# Patient Record
Sex: Female | Born: 1985 | Hispanic: Yes | Marital: Single | Smoking: Never smoker
Health system: Southern US, Community
[De-identification: ages and names within clinical notes are randomized; demographics above are authoritative.]

## PROBLEM LIST (undated history)

## (undated) DIAGNOSIS — O139 Gestational [pregnancy-induced] hypertension without significant proteinuria, unspecified trimester: Secondary | ICD-10-CM

## (undated) DIAGNOSIS — O24419 Gestational diabetes mellitus in pregnancy, unspecified control: Secondary | ICD-10-CM

## (undated) DIAGNOSIS — D649 Anemia, unspecified: Secondary | ICD-10-CM

## (undated) HISTORY — DX: Gestational (pregnancy-induced) hypertension without significant proteinuria, unspecified trimester: O13.9

---

## 2021-01-10 ENCOUNTER — Other Ambulatory Visit: Payer: Self-pay

## 2021-01-10 ENCOUNTER — Ambulatory Visit (INDEPENDENT_AMBULATORY_CARE_PROVIDER_SITE_OTHER): Payer: Medicaid Other

## 2021-01-10 VITALS — BP 119/74 | HR 68 | Ht 59.0 in | Wt 149.0 lb

## 2021-01-10 DIAGNOSIS — Z3201 Encounter for pregnancy test, result positive: Secondary | ICD-10-CM

## 2021-01-10 LAB — POCT URINE PREGNANCY: Preg Test, Ur: POSITIVE — AB

## 2021-01-10 MED ORDER — PRENATAL VITAMINS 28-0.8 MG PO TABS: ORAL_TABLET | ORAL | 11 refills | Status: DC

## 2021-01-10 NOTE — Progress Notes (Signed)
Pt presents for UPT.UPT positive. Pt states LMP was 11/07/20. Advised pt to take prenatal vitamins and to start prenatal care.  Kolston Lacount l Morty Ortwein, CMA .

## 2021-01-11 NOTE — Progress Notes (Signed)
Chart reviewed - agree with CMA/RN documentation.  ° °

## 2021-01-31 ENCOUNTER — Encounter: Payer: Medicaid Other | Admitting: Family Medicine

## 2021-02-06 ENCOUNTER — Other Ambulatory Visit (HOSPITAL_COMMUNITY)
Admission: RE | Admit: 2021-02-06 | Discharge: 2021-02-06 | Disposition: A | Discharge status: Home / Self Care | Payer: Medicaid Other | Source: Ambulatory Visit | Attending: Family Medicine | Admitting: Family Medicine

## 2021-02-06 ENCOUNTER — Encounter: Payer: Self-pay | Admitting: Certified Nurse Midwife

## 2021-02-06 ENCOUNTER — Ambulatory Visit (INDEPENDENT_AMBULATORY_CARE_PROVIDER_SITE_OTHER): Payer: Medicaid Other | Admitting: Certified Nurse Midwife

## 2021-02-06 ENCOUNTER — Other Ambulatory Visit: Payer: Self-pay

## 2021-02-06 VITALS — BP 114/70 | HR 74 | Wt 150.0 lb

## 2021-02-06 DIAGNOSIS — Z8759 Personal history of other complications of pregnancy, childbirth and the puerperium: Secondary | ICD-10-CM | POA: Insufficient documentation

## 2021-02-06 DIAGNOSIS — Z3480 Encounter for supervision of other normal pregnancy, unspecified trimester: Secondary | ICD-10-CM | POA: Diagnosis not present

## 2021-02-06 DIAGNOSIS — N898 Other specified noninflammatory disorders of vagina: Secondary | ICD-10-CM | POA: Diagnosis present

## 2021-02-06 DIAGNOSIS — Z3A11 11 weeks gestation of pregnancy: Secondary | ICD-10-CM

## 2021-02-06 DIAGNOSIS — Z8751 Personal history of pre-term labor: Secondary | ICD-10-CM | POA: Insufficient documentation

## 2021-02-06 MED ORDER — ASPIRIN EC 81 MG PO TBEC: 81.0000 mg | DELAYED_RELEASE_TABLET | Freq: Every day | ORAL | 0 refills | Status: DC

## 2021-02-06 NOTE — Progress Notes (Signed)
DATING AND VIABILITY SONOGRAM   Fayne Mcguffee is a 35 y.o. year old G48P1102 with LMP Patient's last menstrual period was 11/07/2020 (exact date). which would correlate to  [redacted]w[redacted]d weeks gestation.  She has regular menstrual cycles.   She is here today for a confirmatory initial sonogram.    GESTATION: SINGLETON     FETAL ACTIVITY:          Heart rate     159 bpm          The fetus is active.  PLACENTA LOCALIZATION:  anterior    GESTATIONAL AGE AND  BIOMETRICS:  Gestational criteria: Estimated Date of Delivery: 08/23/21 by early ultrasound now at [redacted]w[redacted]d  Previous Scans:0      CROWN RUMP LENGTH           4.87 cm         11-5 weeks   5.04 cm   11-5 weeks                                                                           AVERAGE EGA(BY THIS SCAN):  11-5  weeks  WORKING EDD( early ultrasound ):  08-23-2021     TECHNICIAN COMMENTS:  Patient informed that the ultrasound is considered a limited obstetric ultrasound and is not intended to be a complete ultrasound exam. Patient also informed that the ultrasound is not being completed with the intent of assessing for fetal or placental anomalies or any pelvic abnormalities. Explained that the purpose of today's ultrasound is to assess for fetal heart rate. Patient acknowledges the purpose of the exam and the limitations of the study.     Armandina Stammer 02/06/2021 10:08 AM

## 2021-02-06 NOTE — Progress Notes (Signed)
History:   Aminta Sakurai is a 35 y.o. [id number] at 69w5dby early ultrasound being seen today for her first obstetrical visit.  Her obstetrical history is significant for pre-eclampsia and preterm delivery. Patient does not intend to breast feed. Pregnancy history fully reviewed.  Patient reports nausea. Patient denies taking any medication for nausea. Does not want any medication at this time.      HISTORY: OB History  Gravida Para Term Preterm AB Living  _0 0 2  SAB IAB Ectopic Multiple Live Births  0 0 0 0 2    # Outcome Date GA Lbr Len/2nd Weight Sex Delivery Anes PTL Lv  3 Current           2 Term 2010 339w0d F Vag-Spont None N LIV  1 Preterm 2004 [id number] Vag-Spont None Y LIV     History reviewed. No pertinent past medical history. History reviewed. No pertinent surgical history. Family History  Problem Relation Age of Onset  . Emphysema Mother   . Parkinson's disease Father    Social History   Tobacco Use  . Smoking status: Former [employer] scientist (life sciences) Smokeless tobacco: Never Used  Substance Use Topics  . Alcohol use: Never  . Drug use: Never   No Known Allergies Current Outpatient Medications on File Prior to Visit  Medication Sig Dispense Refill  . Prenatal Vit-Fe Fumarate-FA (PRENATAL VITAMINS) 28-0.8 MG TABS Take 1 tablet by mouth daily. 30 tablet 11   No current facility-administered medications on file prior to visit.    Review of Systems Pertinent items noted in HPI and remainder of comprehensive ROS otherwise negative.  Physical Exam:   Vitals:   02/06/21 0935  BP: 114/70  Pulse: 74  Weight: 150 lb (68 kg)   Fetal Heart Rate (bpm): 159-US   General: well-developed, well-nourished female in no acute distress  Breasts:  normal appearance, no masses or tenderness bilaterally  Skin: normal coloration and turgor, no rashes  Neurologic: oriented, normal, negative, normal mood  Extremities: normal strength, tone, and muscle mass, ROM of all  joints is normal  HEENT PERRLA, extraocular movement intact and sclera clear  Neck supple and no masses  Cardiovascular: regular rate and rhythm  Respiratory:  no respiratory distress, normal breath sounds  Abdomen: soft, non-tender; bowel sounds normal; no masses,  no organomegaly  Pelvic: normal external genitalia, no lesions, normal vaginal mucosa, moderate amount of white thin discharge without odor, normal cervix, pap smear done.     Assessment:    Pregnancy: [id number] Active Problem List   Diagnosis Date Noted  . Supervision of other normal pregnancy, antepartum 02/06/2021  . History of preterm delivery 02/06/2021  . History of pre-eclampsia 02/06/2021     Plan:    1. Supervision of other normal pregnancy, antepartum - Welcomed to practice and introduced self to patient  - Reviewed safety, visitor policy, reassurance about COVID-19 for pregnancy at this time. Discussed possible changes to visits, including televisits, that may occur due to COVID-19.  The office remains open if pt needs to be seen and MAU is open 24 hours/day for OB emergencies. - Anticipatory guidance on upcoming appointments - patient is considered high risk d/t history  - Cytology - PAP( [place]) - Culture, OB Urine - Hemoglobpathy+Fer w/A Thal Rfx - CBC/D/Plt+RPR+Rh+ABO+Rub Ab... - CHL AMB BABYSCRIPTS OPT IN - HgB A1c - Genetic Screening  2. History of preterm delivery - patient reports that she went  into PTL, upon arrival to hospital she was 5cm and they did not stop labor due to new diagnoses of PEC  - Patient reports that with last pregnancy she was on Makena injections and delivered at 38 weeks  - Educated and discussed Makena with patient and initiation of weekly injections at 16 weeks, patient verbalizes understanding and wants to be on Makena   3. History of pre-eclampsia - baseline labs collected today  - Protein / creatinine ratio, urine - Comp Met (CMET) - aspirin EC 81 MG  tablet; Take 1 tablet (81 mg total) by mouth daily. Take after 12 weeks for prevention of preeclampsia later in pregnancy  Dispense: 300 tablet; Refill: 0  4. [redacted] weeks gestation of pregnancy  5. Vaginal discharge - Increased vaginal discharge seen during pelvic examination  - Cervicovaginal ancillary only( [place])   Initial labs drawn. Continue prenatal vitamins. Problem list reviewed and updated. Genetic Screening discussed, NIPS: ordered. Ultrasound discussed; fetal anatomic survey: requested. Anticipatory guidance about prenatal visits given including labs, ultrasounds, and testing. Discussed usage of Babyscripts and virtual visits as additional source of managing and completing prenatal visits in midst of coronavirus and pandemic.   Encouraged to complete MyChart Registration for her ability to review results, send requests, and have questions addressed.  The nature of [place] for [facility]Faculty Practice with multiple MDs and Advanced Practice Providers was explained to patient; also emphasized that residents, students are part of our team. Routine obstetric precautions reviewed. Encouraged to seek out care at office or emergency room [facility] MAU preferred) for urgent and/or emergent concerns. Return in about 4 weeks (around 03/06/2021) for HROB, in person.     Lajean Manes, [place] for [employer], [place]

## 2021-02-06 NOTE — Patient Instructions (Signed)

## 2021-02-07 LAB — CERVICOVAGINAL ANCILLARY ONLY
Bacterial Vaginitis (gardnerella): POSITIVE — AB
Candida Glabrata: NEGATIVE
Candida Vaginitis: POSITIVE — AB
Comment: NEGATIVE
Comment: NEGATIVE
Comment: NEGATIVE

## 2021-02-07 LAB — PROTEIN / CREATININE RATIO, URINE
Creatinine, Urine: 138.4 mg/dL
Protein, Ur: 18.1 mg/dL
Protein/Creat Ratio: 131 mg/g creat (ref 0–200)

## 2021-02-08 LAB — URINE CULTURE, OB REFLEX: Organism ID, Bacteria: NO GROWTH

## 2021-02-08 LAB — CYTOLOGY - PAP
Chlamydia: NEGATIVE
Comment: NEGATIVE
Comment: NEGATIVE
Comment: NEGATIVE
Comment: NORMAL
Diagnosis: NEGATIVE
Diagnosis: REACTIVE
High risk HPV: NEGATIVE
Neisseria Gonorrhea: NEGATIVE
Trichomonas: NEGATIVE

## 2021-02-08 LAB — CULTURE, OB URINE

## 2021-02-08 MED ORDER — METRONIDAZOLE 500 MG PO TABS: 500.0000 mg | ORAL_TABLET | Freq: Two times a day (BID) | ORAL | 0 refills | Status: DC

## 2021-02-08 NOTE — Addendum Note (Signed)
Addended by: Sharyon Cable on: 02/08/2021 03:51 PM   Modules accepted: Orders

## 2021-02-21 LAB — COMPREHENSIVE METABOLIC PANEL
ALT: 12 IU/L (ref 0–32)
AST: 15 IU/L (ref 0–40)
Albumin/Globulin Ratio: 1.1 — ABNORMAL LOW (ref 1.2–2.2)
Albumin: 3.7 g/dL — ABNORMAL LOW (ref 3.8–4.8)
Alkaline Phosphatase: 123 IU/L — ABNORMAL HIGH (ref 44–121)
BUN/Creatinine Ratio: 16 (ref 9–23)
BUN: 9 mg/dL (ref 6–20)
Bilirubin Total: <0.2 mg/dL (ref 0.0–1.2)
CO2: 20 mmol/L (ref 20–29)
Calcium: 9.3 mg/dL (ref 8.7–10.2)
Chloride: 100 mmol/L (ref 96–106)
Creatinine, Ser: 0.55 mg/dL — ABNORMAL LOW (ref 0.57–1.00)
Globulin, Total: 3.5 g/dL (ref 1.5–4.5)
Glucose: 91 mg/dL (ref 65–99)
Potassium: 4.3 mmol/L (ref 3.5–5.2)
Sodium: 136 mmol/L (ref 134–144)
Total Protein: 7.2 g/dL (ref 6.0–8.5)
eGFR: 124 mL/min/{1.73_m2} (ref >59)

## 2021-02-21 LAB — CBC/D/PLT+RPR+RH+ABO+RUB AB...
Antibody Screen: NEGATIVE
Basophils Absolute: 0 10*3/uL (ref 0.0–0.2)
Basos: 0 %
EOS (ABSOLUTE): 0.1 10*3/uL (ref 0.0–0.4)
Eos: 1 %
HCV Ab: <0.1 s/co ratio (ref 0.0–0.9)
HIV Screen 4th Generation wRfx: NONREACTIVE
Hematocrit: 36.8 % (ref 34.0–46.6)
Hemoglobin: 12.1 g/dL (ref 11.1–15.9)
Hepatitis B Surface Ag: NEGATIVE
Immature Grans (Abs): 0.1 10*3/uL (ref 0.0–0.1)
Immature Granulocytes: 1 %
Lymphocytes Absolute: 2 10*3/uL (ref 0.7–3.1)
Lymphs: 17 %
MCH: 24.9 pg — ABNORMAL LOW (ref 26.6–33.0)
MCHC: 32.9 g/dL (ref 31.5–35.7)
MCV: 76 fL — ABNORMAL LOW (ref 79–97)
Monocytes Absolute: 0.7 10*3/uL (ref 0.1–0.9)
Monocytes: 6 %
Neutrophils Absolute: 8.9 10*3/uL — ABNORMAL HIGH (ref 1.4–7.0)
Neutrophils: 75 %
Platelets: 335 10*3/uL (ref 150–450)
RBC: 4.86 x10E6/uL (ref 3.77–5.28)
RDW: 13.8 % (ref 11.7–15.4)
RPR Ser Ql: NONREACTIVE
Rh Factor: POSITIVE
Rubella Antibodies, IGG: 1.03 index (ref >0.99)
WBC: 11.7 10*3/uL — ABNORMAL HIGH (ref 3.4–10.8)

## 2021-02-21 LAB — ALPHA-THALASSEMIA

## 2021-02-21 LAB — HEMOGLOBIN A1C
Est. average glucose Bld gHb Est-mCnc: 111 mg/dL
Hgb A1c MFr Bld: 5.5 % (ref 4.8–5.6)

## 2021-02-21 LAB — HEMOGLOBPATHY+FER W/A THAL RFX
Ferritin: 54 ng/mL (ref 15–150)
Hgb A2: 2.3 % (ref 1.8–3.2)
Hgb A: 97.7 % (ref 96.4–98.8)
Hgb F: 0 % (ref 0.0–2.0)
Hgb S: 0 %

## 2021-02-21 LAB — HCV INTERPRETATION

## 2021-02-26 ENCOUNTER — Encounter: Payer: Self-pay | Admitting: Family Medicine

## 2021-02-26 DIAGNOSIS — D563 Thalassemia minor: Secondary | ICD-10-CM | POA: Insufficient documentation

## 2021-03-08 ENCOUNTER — Ambulatory Visit (INDEPENDENT_AMBULATORY_CARE_PROVIDER_SITE_OTHER): Payer: Medicaid Other | Admitting: Family Medicine

## 2021-03-08 ENCOUNTER — Other Ambulatory Visit: Payer: Self-pay

## 2021-03-08 DIAGNOSIS — Z8759 Personal history of other complications of pregnancy, childbirth and the puerperium: Secondary | ICD-10-CM

## 2021-03-08 DIAGNOSIS — Z3480 Encounter for supervision of other normal pregnancy, unspecified trimester: Secondary | ICD-10-CM

## 2021-03-08 DIAGNOSIS — Z8751 Personal history of pre-term labor: Secondary | ICD-10-CM

## 2021-03-08 DIAGNOSIS — Z3A16 16 weeks gestation of pregnancy: Secondary | ICD-10-CM

## 2021-03-08 DIAGNOSIS — D563 Thalassemia minor: Secondary | ICD-10-CM

## 2021-03-08 MED ORDER — HYDROXYPROGESTERONE CAPROATE 275 MG/1.1ML ~~LOC~~ SOAJ
275.0000 mg | Freq: Once | SUBCUTANEOUS | Status: AC
  Administered 2021-03-08: 275 mg via SUBCUTANEOUS

## 2021-03-08 MED ORDER — HYDROXYPROGESTERONE CAPROATE 250 MG/ML IM OIL: 250.0000 mg | TOPICAL_OIL | Freq: Once | INTRAMUSCULAR | Status: DC

## 2021-03-08 NOTE — Progress Notes (Signed)
   PRENATAL VISIT NOTE  Subjective:  Abigail Davidson is a 35 y.o. G3P1102 at [redacted]w[redacted]d being seen today for ongoing prenatal care.  She is currently monitored for the following issues for this high-risk pregnancy and has Supervision of other normal pregnancy, antepartum; History of preterm delivery; History of pre-eclampsia; and Alpha thalassemia silent carrier on their problem list.  Patient reports no complaints.  Contractions: Not present. Vag. Bleeding: None.  Movement: Present. Denies leaking of fluid.   The following portions of the patient's history were reviewed and updated as appropriate: allergies, current medications, past family history, past medical history, past social history, past surgical history and problem list.   Objective:   Vitals:   03/08/21 1011  BP: 108/67  Pulse: 97  Weight: 150 lb (68 kg)    Fetal Status: Fetal Heart Rate (bpm): 144   Movement: Present     General:  Alert, oriented and cooperative. Patient is in no acute distress.  Skin: Skin is warm and dry. No rash noted.   Cardiovascular: Normal heart rate noted  Respiratory: Normal respiratory effort, no problems with respiration noted  Abdomen: Soft, gravid, appropriate for gestational age.  Pain/Pressure: Present     Pelvic: Cervical exam deferred        Extremities: Normal range of motion.  Edema: None  Mental Status: Normal mood and affect. Normal behavior. Normal judgment and thought content.   Assessment and Plan:  Pregnancy: G3P1102 at [redacted]w[redacted]d 1. [redacted] weeks gestation of pregnancy - [place] MFM OB DETAIL +14 WK; Future - hydroxyprogesterone caproate (MAKENA) 250 mg/mL injection 250 mg - AFP, Serum, Open Spina Bifida  2. Supervision of other normal pregnancy, antepartum FHT and FH normal  3. History of preterm delivery On makena - [place] MFM OB DETAIL +14 WK; Future - hydroxyprogesterone caproate (MAKENA) 250 mg/mL injection 250 mg - AFP, Serum, Open Spina Bifida  4. History of pre-eclampsia On  ASA 81mg . BP normal - MFM OB DETAIL +14 WK; Future - hydroxyprogesterone caproate (MAKENA) 250 mg/mL injection 250 mg  5. Alpha thalassemia silent carrier  - [place] MFM OB DETAIL +14 WK; Future - hydroxyprogesterone caproate (MAKENA) 250 mg/mL injection 250 mg - AFP, Serum, Open Spina Bifida  Preterm labor symptoms and general obstetric precautions including but not limited to vaginal bleeding, contractions, leaking of fluid and fetal movement were reviewed in detail with the patient. Please refer to After Visit Summary for other counseling recommendations.   Return in about 4 weeks (around 04/05/2021) for OB f/u.  No future appointments.  04/07/2021, DO

## 2021-03-08 NOTE — Addendum Note (Signed)
Addended by: Mikey Bussing on: 03/08/2021 10:38 AM   Modules accepted: Orders

## 2021-03-10 LAB — AFP, SERUM, OPEN SPINA BIFIDA
AFP MoM: 1.96
AFP Value: 68.3 ng/mL
Gest. Age on Collection Date: 16 weeks
Maternal Age At EDD: 33.7 yr
OSBR Risk 1 IN: 881
Test Results:: NEGATIVE
Weight: 150 [lb_av]

## 2021-03-16 ENCOUNTER — Ambulatory Visit (INDEPENDENT_AMBULATORY_CARE_PROVIDER_SITE_OTHER): Payer: Medicaid Other

## 2021-03-16 VITALS — BP 105/67 | HR 84 | Wt 154.0 lb

## 2021-03-16 DIAGNOSIS — Z3A17 17 weeks gestation of pregnancy: Secondary | ICD-10-CM | POA: Diagnosis not present

## 2021-03-16 DIAGNOSIS — Z8751 Personal history of pre-term labor: Secondary | ICD-10-CM

## 2021-03-16 MED ORDER — HYDROXYPROGESTERONE CAPROATE 275 MG/1.1ML ~~LOC~~ SOAJ
275.0000 mg | Freq: Once | SUBCUTANEOUS | Status: AC
  Administered 2021-03-16: 275 mg via SUBCUTANEOUS

## 2021-03-16 NOTE — Progress Notes (Signed)
Abigail Davidson here for 17-P  Injection.  Injection administered without complication. Patient will return in one week for next injection.  Kao Berkheimer l Assad Harbeson, CMA 03/16/2021  10:16 AM

## 2021-03-16 NOTE — Progress Notes (Signed)
Patient was assessed and managed by nursing staff during this encounter. I have reviewed the chart and agree with the documentation and plan. I have also made any necessary editorial changes.  Abigail Wickard, MD 03/16/2021 11:42 AM    

## 2021-03-19 ENCOUNTER — Telehealth: Payer: Self-pay

## 2021-03-19 NOTE — Telephone Encounter (Signed)
Patient states she got her second 17P injection on Friday. Patient states she developed hard red area on the back of her arm where she was given injection. Patient also states she was nauseated as well. Patient made aware she can take benadryl and tylenol if needed for reaction.  Will send to provider as patient is not sure she wants to continue Makena injections due to she is thinking this is an allergic reaction.   Armandina Stammer RN

## 2021-03-19 NOTE — Telephone Encounter (Signed)
Simply a reaction to the subcutaneous injection and not an allergic reaction. Can possibly switch to IM injections.

## 2021-03-22 ENCOUNTER — Other Ambulatory Visit: Payer: Self-pay

## 2021-03-22 ENCOUNTER — Ambulatory Visit: Payer: Medicaid Other | Admitting: Family Medicine

## 2021-03-22 ENCOUNTER — Encounter: Payer: Self-pay | Admitting: Family Medicine

## 2021-03-22 VITALS — BP 108/66 | HR 88 | Wt 154.0 lb

## 2021-03-22 DIAGNOSIS — Z8751 Personal history of pre-term labor: Secondary | ICD-10-CM

## 2021-03-22 MED ORDER — PROGESTERONE 200 MG PO CAPS: 200.0000 mg | ORAL_CAPSULE | Freq: Every day | ORAL | 3 refills | Status: DC

## 2021-03-22 NOTE — Progress Notes (Signed)
After last injection, had raised red area that was painful and hot to touch with a lot of itching. Lasted for about a week and now just going away. Improved some with benadryl. Will not give any more. Discussed using prometrium instead - although no clinical trials regarding use of prometrium for PTL, not unreasonable as minimal harm. Patient amenable.

## 2021-03-29 ENCOUNTER — Ambulatory Visit: Payer: Medicaid Other

## 2021-04-04 ENCOUNTER — Other Ambulatory Visit: Payer: Self-pay | Admitting: *Deleted

## 2021-04-04 ENCOUNTER — Ambulatory Visit: Payer: Medicaid Other | Admitting: *Deleted

## 2021-04-04 ENCOUNTER — Ambulatory Visit (HOSPITAL_BASED_OUTPATIENT_CLINIC_OR_DEPARTMENT_OTHER): Payer: Medicaid Other | Admitting: Genetic Counselor

## 2021-04-04 ENCOUNTER — Ambulatory Visit: Payer: Medicaid Other | Attending: Family Medicine

## 2021-04-04 ENCOUNTER — Encounter: Payer: Self-pay | Admitting: Genetic Counselor

## 2021-04-04 ENCOUNTER — Other Ambulatory Visit: Payer: Self-pay

## 2021-04-04 DIAGNOSIS — Z315 Encounter for genetic counseling: Secondary | ICD-10-CM

## 2021-04-04 DIAGNOSIS — Z3A16 16 weeks gestation of pregnancy: Secondary | ICD-10-CM | POA: Diagnosis present

## 2021-04-04 DIAGNOSIS — D563 Thalassemia minor: Secondary | ICD-10-CM

## 2021-04-04 DIAGNOSIS — Z8751 Personal history of pre-term labor: Secondary | ICD-10-CM | POA: Diagnosis not present

## 2021-04-04 DIAGNOSIS — O09212 Supervision of pregnancy with history of pre-term labor, second trimester: Secondary | ICD-10-CM | POA: Diagnosis not present

## 2021-04-04 DIAGNOSIS — O358XX0 Maternal care for other (suspected) fetal abnormality and damage, not applicable or unspecified: Secondary | ICD-10-CM

## 2021-04-04 DIAGNOSIS — Z8759 Personal history of other complications of pregnancy, childbirth and the puerperium: Secondary | ICD-10-CM

## 2021-04-04 DIAGNOSIS — O283 Abnormal ultrasonic finding on antenatal screening of mother: Secondary | ICD-10-CM | POA: Insufficient documentation

## 2021-04-04 DIAGNOSIS — Z3A19 19 weeks gestation of pregnancy: Secondary | ICD-10-CM | POA: Diagnosis not present

## 2021-04-04 DIAGNOSIS — O35EXX0 Maternal care for other (suspected) fetal abnormality and damage, fetal genitourinary anomalies, not applicable or unspecified: Secondary | ICD-10-CM

## 2021-04-04 DIAGNOSIS — Z3480 Encounter for supervision of other normal pregnancy, unspecified trimester: Secondary | ICD-10-CM

## 2021-04-04 NOTE — Progress Notes (Signed)
04/04/2021  Abigail Davidson 01-31-1986 MRN: [id number] DOV: 04/04/2021  [name] presented to the [facility] for a genetics consultation regarding her carrier status for alpha-thalassemia. [name] was accompanied to her appointment by her sister.   Indication for genetic counseling - Silent carrier for alpha-thalassemia  Prenatal history  [name] is a [id number], 35 y.o. female. Her current pregnancy has completed [redacted]w[redacted]d (Estimated Date of Delivery: 08/23/21). [name] has a 63 year old daughter and a 51 year old daughter from prior relationships.  [name] denied exposure to environmental toxins or chemical agents. She denied the use of alcohol, tobacco or street drugs. She reported taking prenatal vitamins and progesterone. She denied significant viral illnesses, fevers, and bleeding during the course of her pregnancy. Her medical and surgical histories were noncontributory.  Family History  A three generation pedigree was drafted and reviewed. The family history is remarkable for the following:  - [name]'s sister has a history of three miscarriages. She also has three healthy children. She indicated that her miscarriages occurred due to her low progesterone levels.  The remaining family histories were reviewed and found to be noncontributory for birth defects, intellectual disability, recurrent pregnancy loss, and known genetic conditions. [name] had limited information about portions of her partner's family history; thus, risk assessment was limited.  The patient's ancestry is [place]. The father of the pregnancy's ancestry is [place]. Ashkenazi Jewish ancestry and consanguinity were denied. Pedigree will be scanned under Media.  Discussion  Alpha-thalassemia:  [name] was referred for genetic counseling as she had Horizon-4 carrier screening perform through [place] that identified her  as a silent carrier for alpha-thalassemia (aa/a-). Alpha-thalassemia is different in its inheritance compared to other hemoglobinopathies/thalassemias as there are two copies of two alpha globin genes (HBA1 and HBA2) on each chromosome 16, or four alpha globin genes total (aa/aa). A person can be a carrier of one alpha gene mutation (aa/a-), also referred to as a "silent carrier". A person who carries two alpha globin gene mutations can either carry them in cis (both on the same chromosome, denoted as aa/--) or in trans (on different chromosomes, denoted as a-/a-).     There are several different forms of alpha-thalassemia. The most severe form of alpha-thalassemia, Hb Barts, is associated with an absence of alpha globin chain synthesis as a result of deletions of all four alpha globin genes (--/--).  Given that [name] is a silent carrier (aa/a-), her pregnancies would not be at increased risk for Hb Barts, even if her partner is a carrier for alpha-thalassemia, as she will always pass on at least one copy of the alpha globin gene to her children. Hemoglobin H (HbH) disease is caused by three deleted or dysfunctioning alpha globin alleles (a-/--) and is characterized by microcytic hypochromic hemolytic anemia, hepatosplenomegaly, mild jaundice, growth retardation, and sometimes thalassemia-like bone changes. Given [name]'s silent carrier status (aa/a-), the current fetus would only be at risk for HbH disease (a-/--), if her partner is a carrier for two alpha globin mutations in cis (aa/--). If this is the case, the risk for HbH disease in the pregnancy would be 1 in 4 (25%). If he is a carrier of alpha-thalassemia in trans or a silent carrier, then the pregnancy would not be at increased risk for HbH disease. Based on the pan-ethnic carrier frequency for alpha-thalassemia, [name]'s partner has  a 1 in 25 chance of being any type of carrier for alpha-thalassemia. Thus, the couple's chance  of having a child with HbH disease is ~1%.  [name] carrier screening was negative for the other 3 conditions screened. Thus, her risk to be a carrier for these additional conditions (listed separately in the laboratory report) has been reduced but not eliminated. This also significantly reduces her risk of having a child affected by one of these conditions. We discussed that carrier testing for alpha-thalassemia is recommended for [name]'s partner. [name] indicated that she is interested in pursuing partner carrier screening.  Aneuploidy screening results:  We also reviewed that [name] had [facility] noninvasive prenatal screening (NIPS) through the laboratory [facility] that was low-risk for fetal aneuploidies. We reviewed that these results showed a less than 1 in 10,000 risk for trisomies 21, 18 and 13, and monosomy X (Turner syndrome). In addition, the risk for triploidy and sex chromosome trisomies (47,XXX and 47,XXY) was also low. [name] elected to have cfDNA analysis for 22q11.2 deletion syndrome, which was also low risk (1 in 12,000). We reviewed that while this testing identifies 94-99% of pregnancies with trisomy 69, trisomy 83, trisomy 71, and >70% of cases of sex chromosome aneuploidies, it is NOT diagnostic. A positive test result requires confirmation by CVS or amniocentesis, and a negative test result does not rule out a fetal chromosome abnormality. She also understands that this testing does not identify all genetic conditions.  Ultrasound:  A complete ultrasound was performed today prior to our visit. The ultrasound report will be sent under separate cover. Mild bilateral pyelectasis was noted on today's ultrasound.  Pyelectasis, or urinary tract dilation (UTD) is a mild enlargement of the central area, or "pelvis" of the kidney. The increase in size may be the result of urine not being able to flow freely from the kidney to the bladder due to  ureteropelvic junction obstruction. Urine can also back up from the bladder into the kidneys and cause dilation; this is known as vesicoureteral reflux. [name] was counseled that pyelectasis is considered a soft marker for Down syndrome. It is present in approximately 1-2% of chromosomally normal fetuses, but up to 17% of fetuses with Down syndrome. Given [name]'s low-risk NIPS result, the finding of pyelectasis is likely unrelated to Down syndrome.   [name] was informed that pyelectasis often resolves during pregnancy. The fetal kidneys will continue to be monitored to determine if there is concern for an obstructive uropathy later in pregnancy.  Diagnostic testing:  [name] was also counseled regarding diagnostic testing via amniocentesis. We discussed the technical aspects of the procedure and quoted up to a 1 in 500 (0.2%) risk for spontaneous pregnancy loss or other adverse pregnancy outcomes as a result of amniocentesis. Cultured cells from an amniocentesis sample allow for the visualization of a fetal karyotype, which can detect >99% of large chromosomal aberrations, including trisomy 21 (Down syndrome). Chromosomal microarray can also be performed to identify smaller deletions or duplications of fetal chromosomal material. Amniocentesis could also be performed to assess whether the baby is affected by alpha-thalassemia.   [name] was counseled that amniocentesis is the only way to definitively determine if a fetus has a chromosomal aneuploidy such as Down syndrome or alpha-thalassemia prenatally. She was informed that she also has the option of monitoring the pregnancy via ultrasound to see if the fetus's pyelectasis will resolve, remain  stable, or worsen. We discussed that normal amniotic fluid levels and a normal-appearing fetal bladder will suggest that one or both of the fetus's kidneys are working properly.  After careful consideration, [name] declined amniocentesis at this time. Amniocentesis is available at any point after 16 weeks of pregnancy and she may opt to undergo the procedure at a later date should she change her mind.  Plan:  [name] indicated that she may be interested in pursuing alpha-thalassemia carrier screening for her partner, Abigail Davidson. However, she wanted to discuss this option with him prior to ordering testing. She also believed that he may not have health insurance. We made a plan for her to contact me via MyChart once she has had the opportunity to discuss testing with her partner. If he has insurance, she will send me a photo of his insurance card so that I can perform a benefits investigation to estimate the couple's out of pocket cost for testing. If he does not have insurance, he may qualify for free testing. [name] was agreeable with this plan.  I counseled [name] regarding the above risks and available options. The approximate face-to-face time with the genetic counselor was 30 minutes.  In summary:  Discussed carrier screening results options for follow-up testing  Silent carrier for alpha-thalassemia  Interested in partner carrier screening. Will discuss option with partner and contact me to facilitate testing if interested  Reviewed low-risk NIPS results  Reduction in risk for Down syndrome, trisomy 54, trisomy 82, triploidy, sex chromosome aneuploidies, and 22q11.2 deletion syndrome  Reviewed results of ultrasound  Bilateral pyelectasis seen  Likely unrelated to fetal Down syndrome given low-risk NIPS result  Offered additional testing and screening  Declined amniocentesis  Will continue to monitor pyelectasis via ultrasound  Reviewed family history concerns   Gershon Crane, MS, Aeronautical engineer

## 2021-04-06 ENCOUNTER — Other Ambulatory Visit: Payer: Self-pay

## 2021-04-06 ENCOUNTER — Ambulatory Visit (INDEPENDENT_AMBULATORY_CARE_PROVIDER_SITE_OTHER): Payer: Medicaid Other | Admitting: Family Medicine

## 2021-04-06 VITALS — BP 106/68 | HR 90 | Wt 157.0 lb

## 2021-04-06 DIAGNOSIS — Z8759 Personal history of other complications of pregnancy, childbirth and the puerperium: Secondary | ICD-10-CM

## 2021-04-06 DIAGNOSIS — Z3A20 20 weeks gestation of pregnancy: Secondary | ICD-10-CM

## 2021-04-06 DIAGNOSIS — Z8751 Personal history of pre-term labor: Secondary | ICD-10-CM

## 2021-04-06 DIAGNOSIS — O283 Abnormal ultrasonic finding on antenatal screening of mother: Secondary | ICD-10-CM

## 2021-04-06 DIAGNOSIS — Z3480 Encounter for supervision of other normal pregnancy, unspecified trimester: Secondary | ICD-10-CM

## 2021-04-06 MED ORDER — ASPIRIN EC 81 MG PO TBEC: 81.0000 mg | DELAYED_RELEASE_TABLET | Freq: Every day | ORAL | 1 refills | Status: DC

## 2021-04-06 NOTE — Progress Notes (Signed)
   PRENATAL VISIT NOTE  Subjective:  Abigail Davidson is a 35 y.o. G3P1102 at [redacted]w[redacted]d being seen today for ongoing prenatal care.  She is currently monitored for the following issues for this high-risk pregnancy and has Supervision of other normal pregnancy, antepartum; History of preterm delivery; History of pre-eclampsia; and Alpha thalassemia silent carrier on their problem list.  Patient reports no complaints.  Contractions: Not present. Vag. Bleeding: None.  Movement: Present. Denies leaking of fluid.   The following portions of the patient's history were reviewed and updated as appropriate: allergies, current medications, past family history, past medical history, past social history, past surgical history and problem list.   Objective:   Vitals:   04/06/21 1048  BP: 106/68  Pulse: 90  Weight: 157 lb (71.2 kg)    Fetal Status: Fetal Heart Rate (bpm): 131 Fundal Height: 20 cm Movement: Present     General:  Alert, oriented and cooperative. Patient is in no acute distress.  Skin: Skin is warm and dry. No rash noted.   Cardiovascular: Normal heart rate noted  Respiratory: Normal respiratory effort, no problems with respiration noted  Abdomen: Soft, gravid, appropriate for gestational age.  Pain/Pressure: Present     Pelvic: Cervical exam deferred        Extremities: Normal range of motion.  Edema: None  Mental Status: Normal mood and affect. Normal behavior. Normal judgment and thought content.   Assessment and Plan:  Pregnancy: G3P1102 at [redacted]w[redacted]d 1. [redacted] weeks gestation of pregnancy  2. Supervision of other normal pregnancy, antepartum FHT and FH normal  3. History of pre-eclampsia Continue ASA 81mg  - aspirin EC 81 MG tablet; Take 1 tablet (81 mg total) by mouth daily. Take after 12 weeks for prevention of preeclampsia later in pregnancy  Dispense: 300 tablet; Refill: 1  4. History of preterm delivery Had reaction to [facility]. Changed to prometrium, understanding that no  studies have shown efficacy.  5. Abnormal fetal ultrasound UTD. Has rpt [facility] in June.  Preterm labor symptoms and general obstetric precautions including but not limited to vaginal bleeding, contractions, leaking of fluid and fetal movement were reviewed in detail with the patient. Please refer to After Visit Summary for other counseling recommendations.   Return in about 4 weeks (around 05/04/2021) for OB f/u.  Future Appointments  Date Time Provider Department Center  05/02/2021 11:15 AM 05/04/2021, MD [facility] None  05/30/2021  9:30 AM [facility] NURSE [facility]  05/30/2021  9:45 AM [facility] US5 [facility]  06/01/2021  9:15 AM 06/03/2021, DO [facility] None    Levie Heritage, DO

## 2021-04-16 ENCOUNTER — Ambulatory Visit: Payer: Self-pay

## 2021-04-24 ENCOUNTER — Encounter (HOSPITAL_COMMUNITY): Payer: Self-pay | Admitting: Obstetrics & Gynecology

## 2021-04-24 ENCOUNTER — Inpatient Hospital Stay (HOSPITAL_COMMUNITY)
Admission: AD | Admit: 2021-04-24 | Discharge: 2021-04-24 | Disposition: A | Discharge status: Home / Self Care | Payer: Medicaid Other | Attending: Obstetrics & Gynecology | Admitting: Obstetrics & Gynecology

## 2021-04-24 ENCOUNTER — Other Ambulatory Visit: Payer: Self-pay

## 2021-04-24 DIAGNOSIS — R110 Nausea: Secondary | ICD-10-CM | POA: Diagnosis not present

## 2021-04-24 DIAGNOSIS — Z3A22 22 weeks gestation of pregnancy: Secondary | ICD-10-CM | POA: Insufficient documentation

## 2021-04-24 DIAGNOSIS — Z87891 Personal history of nicotine dependence: Secondary | ICD-10-CM | POA: Insufficient documentation

## 2021-04-24 DIAGNOSIS — N898 Other specified noninflammatory disorders of vagina: Secondary | ICD-10-CM | POA: Insufficient documentation

## 2021-04-24 DIAGNOSIS — O26892 Other specified pregnancy related conditions, second trimester: Secondary | ICD-10-CM | POA: Insufficient documentation

## 2021-04-24 DIAGNOSIS — Z8759 Personal history of other complications of pregnancy, childbirth and the puerperium: Secondary | ICD-10-CM

## 2021-04-24 DIAGNOSIS — Z3480 Encounter for supervision of other normal pregnancy, unspecified trimester: Secondary | ICD-10-CM

## 2021-04-24 DIAGNOSIS — Z7982 Long term (current) use of aspirin: Secondary | ICD-10-CM | POA: Diagnosis not present

## 2021-04-24 DIAGNOSIS — O09213 Supervision of pregnancy with history of pre-term labor, third trimester: Secondary | ICD-10-CM | POA: Diagnosis not present

## 2021-04-24 DIAGNOSIS — R109 Unspecified abdominal pain: Secondary | ICD-10-CM | POA: Insufficient documentation

## 2021-04-24 DIAGNOSIS — O09212 Supervision of pregnancy with history of pre-term labor, second trimester: Secondary | ICD-10-CM | POA: Diagnosis not present

## 2021-04-24 DIAGNOSIS — Z79899 Other long term (current) drug therapy: Secondary | ICD-10-CM | POA: Diagnosis not present

## 2021-04-24 DIAGNOSIS — R102 Pelvic and perineal pain: Secondary | ICD-10-CM | POA: Insufficient documentation

## 2021-04-24 DIAGNOSIS — Z3492 Encounter for supervision of normal pregnancy, unspecified, second trimester: Secondary | ICD-10-CM

## 2021-04-24 DIAGNOSIS — Z8751 Personal history of pre-term labor: Secondary | ICD-10-CM

## 2021-04-24 LAB — WET PREP, GENITAL
Clue Cells Wet Prep HPF POC: NONE SEEN
Sperm: NONE SEEN
Trich, Wet Prep: NONE SEEN
Yeast Wet Prep HPF POC: NONE SEEN

## 2021-04-24 LAB — URINALYSIS, ROUTINE W REFLEX MICROSCOPIC
Bilirubin Urine: NEGATIVE
Glucose, UA: NEGATIVE mg/dL
Hgb urine dipstick: NEGATIVE
Ketones, ur: NEGATIVE mg/dL
Leukocytes,Ua: NEGATIVE
Nitrite: NEGATIVE
Protein, ur: NEGATIVE mg/dL
Specific Gravity, Urine: 1.012 (ref 1.005–1.030)
pH: 6 (ref 5.0–8.0)

## 2021-04-24 MED ORDER — ACETAMINOPHEN 500 MG PO TABS
1000.0000 mg | ORAL_TABLET | Freq: Once | ORAL | Status: AC
  Administered 2021-04-24: 1000 mg via ORAL
  Filled 2021-04-24: qty 2

## 2021-04-24 NOTE — MAU Note (Signed)
Presents with c/o lower abdominal cramping and pelvic pressure that began yesterday morning.  Denies VB.  Last intercourse 5 days ago.  Also reports increase in vaginal discharge, no odor. Denies LOF.  Endorses +FM.

## 2021-04-24 NOTE — MAU Provider Note (Addendum)
History     CSN: [id number]  Arrival date and time: 04/24/21 1056  Event Date/Time  First Provider Initiated Contact with Patient 04/24/21 1137     Chief Complaint  Patient presents with  . Abdominal Pain  . Pelvic Pressure   HPI   Abigail Davidson is a 35 y.o. T0P5465 at [redacted]w[redacted]d who presents to MAU with chief complaints of pelvic pain and vaginal discharge. She states the pelvic pressure began about 1 day ago. She rated it a 7/10 and dull and achy. She describes it as intermittent with occasional radiation to her back. She notes her abdomen become rock hard during them and believes they are similar to CTX. She notes they are about 10 mins apart. They are not worsening. She has not taken any medication for this. She has had some nausea without vomiting with the pressure. She has had increased white vaginal discharge for one week without any odor. Her last day of intercourse was 5 days ago. She is not concerned about STIs. Endorses fetal movements.   She has a history of preterm labor at 33 weeks with preE. She was offered 17p with her past pregnancy and delivered at 38 weeks. She declined 17p for this pregnancy and started on prometrium about one month ago. She has been taking ASA 81 mg this pregnancy.    OB History    Gravida  3   Para  2   Term  1   Preterm  1   AB      Living  2     SAB      IAB      Ectopic      Multiple      Live Births  2           Past Medical History:  Diagnosis Date  . Pregnancy induced hypertension   . Preterm labor     History reviewed. No pertinent surgical history.  Family History  Problem Relation Age of Onset  . Emphysema Mother   . Parkinson's disease Father     Social History   Tobacco Use  . Smoking status: Former Games developer  . Smokeless tobacco: Never Used  Vaping Use  . Vaping Use: Never used  Substance Use Topics  . Alcohol use: Never  . Drug use: Never    Allergies: No Known Allergies  Medications  Prior to Admission  Medication Sig Dispense Refill Last Dose  . aspirin EC 81 MG tablet Take 1 tablet (81 mg total) by mouth daily. Take after 12 weeks for prevention of preeclampsia later in pregnancy 300 tablet 1 04/23/2021 at Unknown time  . Prenatal Vit-Fe Fumarate-FA (PRENATAL VITAMINS) 28-0.8 MG TABS Take 1 tablet by mouth daily. 30 tablet 11 04/23/2021 at Unknown time  . progesterone (PROMETRIUM) 200 MG capsule Take 1 capsule (200 mg total) by mouth daily. 90 capsule 3 04/23/2021 at Unknown time    Review of Systems  Constitutional: Negative for chills, diaphoresis and fever.  Respiratory: Negative for chest tightness and shortness of breath.   Cardiovascular: Negative for chest pain.  Gastrointestinal: Positive for abdominal pain, diarrhea (yesterday) and nausea. Negative for abdominal distention and vomiting.  Genitourinary: Positive for pelvic pain and vaginal discharge. Negative for difficulty urinating, dysuria, frequency, hematuria and vaginal bleeding.  Neurological: Positive for headaches (yesterday). Negative for dizziness and light-headedness.   Physical Exam   Blood pressure 111/72, pulse 93, temperature 98.2 F (36.8 C), temperature source Oral, resp. rate 18, height 4\' 11"  (  1.499 m), weight 72.6 kg, last menstrual period 11/07/2020, SpO2 100 %.  Physical Exam Exam conducted with a chaperone present.  Constitutional:      General: She is not in acute distress. HENT:     Head: Normocephalic and atraumatic.  Cardiovascular:     Rate and Rhythm: Normal rate and regular rhythm.     Heart sounds: Normal heart sounds.  Pulmonary:     Breath sounds: Normal breath sounds.  Abdominal:     Tenderness: There is no abdominal tenderness. There is no right CVA tenderness or left CVA tenderness.     Comments: gravid  Genitourinary:    Pubic Area: No rash.      Vagina: Vaginal discharge (white creamy) present.     Comments: Pelvic exam: External genitalia normal, vaginal walls  pink and well rugated, cervix visually closed, no lesions noted.   Skin:    General: Skin is warm and dry.  Neurological:     Mental Status: She is alert and oriented to person, place, and time.  Psychiatric:        Behavior: Behavior normal.     MAU Course  Procedures  --Hx PTL at 33 weeks in conjunction with new diagnosis of PEC --17 P with previous pregnancy, SVD at 38 weeks --Initiated 17P this pregnancy, local injection reaction, discontinued use and switched to oral Prometrium - U/S on 04/04/21 showed cervical length of 4.07 cm. - Pertinent negatives: abdominal tenderness, vaginal bleeding, cervical dilation - Abnormal discharge within expectations for patient on Prometrium - Pain improving with Tylenol  Patient Vitals for the past 24 hrs:  BP Temp Temp src Pulse Resp SpO2 Height Weight  04/24/21 1325 98/63 -- -- 80 -- -- -- --  04/24/21 1134 111/72 -- -- 93 -- -- -- --  04/24/21 1111 97/70 98.2 F (36.8 C) Oral 100 18 100 % -- --  04/24/21 1104 -- -- -- -- -- -- 4\' 11"  (1.499 m) 72.6 kg   Results for orders placed or performed during the hospital encounter of 04/24/21 (from the past 24 hour(s))  Urinalysis, Routine w reflex microscopic Urine, Clean Catch     Status: Abnormal   Collection Time: 04/24/21 11:38 AM  Result Value Ref Range   Color, Urine YELLOW YELLOW   APPearance HAZY (A) CLEAR   Specific Gravity, Urine 1.012 1.005 - 1.030   pH 6.0 5.0 - 8.0   Glucose, UA NEGATIVE NEGATIVE mg/dL   Hgb urine dipstick NEGATIVE NEGATIVE   Bilirubin Urine NEGATIVE NEGATIVE   Ketones, ur NEGATIVE NEGATIVE mg/dL   Protein, ur NEGATIVE NEGATIVE mg/dL   Nitrite NEGATIVE NEGATIVE   Leukocytes,Ua NEGATIVE NEGATIVE  Wet prep, genital     Status: Abnormal   Collection Time: 04/24/21 12:08 PM   Specimen: Cervix  Result Value Ref Range   Yeast Wet Prep HPF POC NONE SEEN NONE SEEN   Trich, Wet Prep NONE SEEN NONE SEEN   Clue Cells Wet Prep HPF POC NONE SEEN NONE SEEN   WBC, Wet  Prep HPF POC MANY (A) NONE SEEN   Sperm NONE SEEN    Assessment and Plan  --35 y.o. 32 at [redacted]w[redacted]d  --FHT 147 --Hx preterm labor, birth at 87 weeks --Cervix visually closed --Pain responding to Tylenol, continue 650 mg q 4 hours PRN --Discharge home in stable condition  33, MSN, CNM Certified Nurse Midwife, [employer] for [employer], [facility] 04/24/21 2:41 PM

## 2021-04-24 NOTE — Discharge Instructions (Signed)
Abdominal Pain During Pregnancy Abdominal pain is common during pregnancy and has many possible causes. Some causes are more serious than others, and sometimes the cause is not known. Abdominal pain can be a sign that labor is starting. It can also be caused by normal growth of your baby causing stretching of muscles and ligaments during pregnancy. Always tell your health care provider if you have any abdominal pain. Follow these instructions at home:  Do not have sex or put anything in your vagina until your pain goes away completely.  Get plenty of rest until your pain improves.  Drink enough fluid to keep your urine pale yellow.  Take over-the-counter and prescription medicines only as told by your health care provider.  Keep all follow-up visits. This is important.   Contact a health care provider if:  Your pain continues or gets worse after resting.  You have lower abdominal pain that: ? Comes and goes at regular intervals. ? Spreads to your back. ? Is similar to menstrual cramps.  You have pain or burning when you urinate. Get help right away if:  You have a fever, chills, or shortness of breath.  You have vaginal bleeding.  You are leaking fluid or passing tissue from your vagina.  You have vomiting or diarrhea that lasts for more than 24 hours.  Your baby is moving less than usual.  You feel very weak or faint.  You develop severe pain in your upper abdomen. Summary  Abdominal pain is common during pregnancy and has many possible causes.  If you experience abdominal pain during pregnancy, tell your health care provider right away.  Follow your health care provider's home care instructions and keep all follow-up visits as told. This information is not intended to replace advice given to you by your health care provider. Make sure you discuss any questions you have with your health care provider. Document Revised: 08/08/2020 Document Reviewed: 08/08/2020 Elsevier  Patient Education  2021 Elsevier Inc.  

## 2021-04-25 LAB — GC/CHLAMYDIA PROBE AMP (~~LOC~~) NOT AT ARMC
Chlamydia: NEGATIVE
Comment: NEGATIVE
Comment: NORMAL
Neisseria Gonorrhea: NEGATIVE

## 2021-05-02 ENCOUNTER — Encounter: Payer: Self-pay | Admitting: Family Medicine

## 2021-05-02 ENCOUNTER — Ambulatory Visit (INDEPENDENT_AMBULATORY_CARE_PROVIDER_SITE_OTHER): Payer: Medicaid Other | Admitting: Family Medicine

## 2021-05-02 ENCOUNTER — Other Ambulatory Visit: Payer: Self-pay

## 2021-05-02 VITALS — BP 116/74 | HR 96 | Wt 164.0 lb

## 2021-05-02 DIAGNOSIS — Z8751 Personal history of pre-term labor: Secondary | ICD-10-CM

## 2021-05-02 DIAGNOSIS — Z3A23 23 weeks gestation of pregnancy: Secondary | ICD-10-CM

## 2021-05-02 DIAGNOSIS — Z3480 Encounter for supervision of other normal pregnancy, unspecified trimester: Secondary | ICD-10-CM

## 2021-05-02 DIAGNOSIS — Z8759 Personal history of other complications of pregnancy, childbirth and the puerperium: Secondary | ICD-10-CM

## 2021-05-02 NOTE — Progress Notes (Signed)
   Subjective:  LEEANNE BUTTERS is a 35 y.o. G3P1102 at [redacted]w[redacted]d being seen today for ongoing prenatal care.  She is currently monitored for the following issues for this high-risk pregnancy and has Supervision of other normal pregnancy, antepartum; History of preterm delivery; History of pre-eclampsia; and Alpha thalassemia silent carrier on their problem list.  Patient reports no complaints.  Contractions: Not present. Vag. Bleeding: None.  Movement: Present. Denies leaking of fluid.   The following portions of the patient's history were reviewed and updated as appropriate: allergies, current medications, past family history, past medical history, past social history, past surgical history and problem list. Problem list updated.  Objective:   Vitals:   05/02/21 1108  BP: 116/74  Pulse: 96  Weight: 164 lb (74.4 kg)    Fetal Status: Fetal Heart Rate (bpm): 154 Fundal Height: 25 cm Movement: Present     General:  Alert, oriented and cooperative. Patient is in no acute distress.  Skin: Skin is warm and dry. No rash noted.   Cardiovascular: Normal heart rate noted  Respiratory: Normal respiratory effort, no problems with respiration noted  Abdomen: Soft, gravid, appropriate for gestational age. Pain/Pressure: Present     Pelvic: Vag. Bleeding: None     Cervical exam deferred        Extremities: Normal range of motion.  Edema: Trace  Mental Status: Normal mood and affect. Normal behavior. Normal judgment and thought content.   Urinalysis:      Assessment and Plan:  Pregnancy: G3P1102 at [redacted]w[redacted]d  1. [redacted] weeks gestation of pregnancy   2. Supervision of other normal pregnancy, antepartum BP and FHR normal Reviewed 3rd trimester labs for next visit, needs to come fasting  3. History of preterm delivery Taking prometrium  4. History of pre-eclampsia On ASA  Preterm labor symptoms and general obstetric precautions including but not limited to vaginal bleeding, contractions,  leaking of fluid and fetal movement were reviewed in detail with the patient. Please refer to After Visit Summary for other counseling recommendations.  Return in 4 weeks (on 05/30/2021) for ob visit.   Venora Maples, MD

## 2021-05-02 NOTE — Patient Instructions (Signed)
 Contraception Choices Contraception, also called birth control, refers to methods or devices that prevent pregnancy. Hormonal methods Contraceptive implant A contraceptive implant is a thin, plastic tube that contains a hormone that prevents pregnancy. It is different from an intrauterine device (IUD). It is inserted into the upper part of the arm by a health care provider. Implants can be effective for up to 3 years. Progestin-only injections Progestin-only injections are injections of progestin, a synthetic form of the hormone progesterone. They are given every 3 months by a health care provider. Birth control pills Birth control pills are pills that contain hormones that prevent pregnancy. They must be taken once a day, preferably at the same time each day. A prescription is needed to use this method of contraception. Birth control patch The birth control patch contains hormones that prevent pregnancy. It is placed on the skin and must be changed once a week for three weeks and removed on the fourth week. A prescription is needed to use this method of contraception. Vaginal ring A vaginal ring contains hormones that prevent pregnancy. It is placed in the vagina for three weeks and removed on the fourth week. After that, the process is repeated with a new ring. A prescription is needed to use this method of contraception. Emergency contraceptive Emergency contraceptives prevent pregnancy after unprotected sex. They come in pill form and can be taken up to 5 days after sex. They work best the sooner they are taken after having sex. Most emergency contraceptives are available without a prescription. This method should not be used as your only form of birth control.   Barrier methods Female condom A female condom is a thin sheath that is worn over the penis during sex. Condoms keep sperm from going inside a woman's body. They can be used with a sperm-killing substance (spermicide) to increase their  effectiveness. They should be thrown away after one use. Female condom A female condom is a soft, loose-fitting sheath that is put into the vagina before sex. The condom keeps sperm from going inside a woman's body. They should be thrown away after one use. Diaphragm A diaphragm is a soft, dome-shaped barrier. It is inserted into the vagina before sex, along with a spermicide. The diaphragm blocks sperm from entering the uterus, and the spermicide kills sperm. A diaphragm should be left in the vagina for 6-8 hours after sex and removed within 24 hours. A diaphragm is prescribed and fitted by a health care provider. A diaphragm should be replaced every 1-2 years, after giving birth, after gaining more than 15 lb (6.8 kg), and after pelvic surgery. Cervical cap A cervical cap is a round, soft latex or plastic cup that fits over the cervix. It is inserted into the vagina before sex, along with spermicide. It blocks sperm from entering the uterus. The cap should be left in place for 6-8 hours after sex and removed within 48 hours. A cervical cap must be prescribed and fitted by a health care provider. It should be replaced every 2 years. Sponge A sponge is a soft, circular piece of polyurethane foam with spermicide in it. The sponge helps block sperm from entering the uterus, and the spermicide kills sperm. To use it, you make it wet and then insert it into the vagina. It should be inserted before sex, left in for at least 6 hours after sex, and removed and thrown away within 30 hours. Spermicides Spermicides are chemicals that kill or block sperm from entering the   cervix and uterus. They can come as a cream, jelly, suppository, foam, or tablet. A spermicide should be inserted into the vagina with an applicator at least 10-15 minutes before sex to allow time for it to work. The process must be repeated every time you have sex. Spermicides do not require a prescription.   Intrauterine  contraception Intrauterine device (IUD) An IUD is a T-shaped device that is put in a woman's uterus. There are two types:  Hormone IUD.This type contains progestin, a synthetic form of the hormone progesterone. This type can stay in place for 3-5 years.  Copper IUD.This type is wrapped in copper wire. It can stay in place for 10 years. Permanent methods of contraception Female tubal ligation In this method, a woman's fallopian tubes are sealed, tied, or blocked during surgery to prevent eggs from traveling to the uterus. Hysteroscopic sterilization In this method, a small, flexible insert is placed into each fallopian tube. The inserts cause scar tissue to form in the fallopian tubes and block them, so sperm cannot reach an egg. The procedure takes about 3 months to be effective. Another form of birth control must be used during those 3 months. Female sterilization This is a procedure to tie off the tubes that carry sperm (vasectomy). After the procedure, the man can still ejaculate fluid (semen). Another form of birth control must be used for 3 months after the procedure. Natural planning methods Natural family planning In this method, a couple does not have sex on days when the woman could become pregnant. Calendar method In this method, the woman keeps track of the length of each menstrual cycle, identifies the days when pregnancy can happen, and does not have sex on those days. Ovulation method In this method, a couple avoids sex during ovulation. Symptothermal method This method involves not having sex during ovulation. The woman typically checks for ovulation by watching changes in her temperature and in the consistency of cervical mucus. Post-ovulation method In this method, a couple waits to have sex until after ovulation. Where to find more information  Centers for Disease Control and Prevention: www.cdc.gov Summary  Contraception, also called birth control, refers to methods or  devices that prevent pregnancy.  Hormonal methods of contraception include implants, injections, pills, patches, vaginal rings, and emergency contraceptives.  Barrier methods of contraception can include female condoms, female condoms, diaphragms, cervical caps, sponges, and spermicides.  There are two types of IUDs (intrauterine devices). An IUD can be put in a woman's uterus to prevent pregnancy for 3-5 years.  Permanent sterilization can be done through a procedure for males and females. Natural family planning methods involve nothaving sex on days when the woman could become pregnant. This information is not intended to replace advice given to you by your health care provider. Make sure you discuss any questions you have with your health care provider. Document Revised: 05/01/2020 Document Reviewed: 05/01/2020 Elsevier Patient Education  2021 [employer] Inc.   Breastfeeding  Choosing to breastfeed is one of the best decisions you can make for yourself and your baby. A change in hormones during pregnancy causes your breasts to make breast milk in your milk-producing glands. Hormones prevent breast milk from being released before your baby is born. They also prompt milk flow after birth. Once breastfeeding has begun, thoughts of your baby, as well as his or her sucking or crying, can stimulate the release of milk from your milk-producing glands. Benefits of breastfeeding Research shows that breastfeeding offers many health benefits   for infants and mothers. It also offers a cost-free and convenient way to feed your baby. For your baby  Your first milk (colostrum) helps your baby's digestive system to function better.  Special cells in your milk (antibodies) help your baby to fight off infections.  Breastfed babies are less likely to develop asthma, allergies, obesity, or type 2 diabetes. They are also at lower risk for sudden infant death syndrome (SIDS).  Nutrients in breast milk are better  able to meet your baby's needs compared to infant formula.  Breast milk improves your baby's brain development. For you  Breastfeeding helps to create a very special bond between you and your baby.  Breastfeeding is convenient. Breast milk costs nothing and is always available at the correct temperature.  Breastfeeding helps to burn calories. It helps you to lose the weight that you gained during pregnancy.  Breastfeeding makes your uterus return faster to its size before pregnancy. It also slows bleeding (lochia) after you give birth.  Breastfeeding helps to lower your risk of developing type 2 diabetes, osteoporosis, rheumatoid arthritis, cardiovascular disease, and breast, ovarian, uterine, and endometrial cancer later in life. Breastfeeding basics Starting breastfeeding  Find a comfortable place to sit or lie down, with your neck and back well-supported.  Place a pillow or a rolled-up blanket under your baby to bring him or her to the level of your breast (if you are seated). Nursing pillows are specially designed to help support your arms and your baby while you breastfeed.  Make sure that your baby's tummy (abdomen) is facing your abdomen.  Gently massage your breast. With your fingertips, massage from the outer edges of your breast inward toward the nipple. This encourages milk flow. If your milk flows slowly, you may need to continue this action during the feeding.  Support your breast with 4 fingers underneath and your thumb above your nipple (make the letter "C" with your hand). Make sure your fingers are well away from your nipple and your baby's mouth.  Stroke your baby's lips gently with your finger or nipple.  When your baby's mouth is open wide enough, quickly bring your baby to your breast, placing your entire nipple and as much of the areola as possible into your baby's mouth. The areola is the colored area around your nipple. ? More areola should be visible above your  baby's upper lip than below the lower lip. ? Your baby's lips should be opened and extended outward (flanged) to ensure an adequate, comfortable latch. ? Your baby's tongue should be between his or her lower gum and your breast.  Make sure that your baby's mouth is correctly positioned around your nipple (latched). Your baby's lips should create a seal on your breast and be turned out (everted).  It is common for your baby to suck about 2-3 minutes in order to start the flow of breast milk. Latching Teaching your baby how to latch onto your breast properly is very important. An improper latch can cause nipple pain, decreased milk supply, and poor weight gain in your baby. Also, if your baby is not latched onto your nipple properly, he or she may swallow some air during feeding. This can make your baby fussy. Burping your baby when you switch breasts during the feeding can help to get rid of the air. However, teaching your baby to latch on properly is still the best way to prevent fussiness from swallowing air while breastfeeding. Signs that your baby has successfully latched onto   your nipple  Silent tugging or silent sucking, without causing you pain. Infant's lips should be extended outward (flanged).  Swallowing heard between every 3-4 sucks once your milk has started to flow (after your let-down milk reflex occurs).  Muscle movement above and in front of his or her ears while sucking. Signs that your baby has not successfully latched onto your nipple  Sucking sounds or smacking sounds from your baby while breastfeeding.  Nipple pain. If you think your baby has not latched on correctly, slip your finger into the corner of your baby's mouth to break the suction and place it between your baby's gums. Attempt to start breastfeeding again. Signs of successful breastfeeding Signs from your baby  Your baby will gradually decrease the number of sucks or will completely stop sucking.  Your baby  will fall asleep.  Your baby's body will relax.  Your baby will retain a small amount of milk in his or her mouth.  Your baby will let go of your breast by himself or herself. Signs from you  Breasts that have increased in firmness, weight, and size 1-3 hours after feeding.  Breasts that are softer immediately after breastfeeding.  Increased milk volume, as well as a change in milk consistency and color by the fifth day of breastfeeding.  Nipples that are not sore, cracked, or bleeding. Signs that your baby is getting enough milk  Wetting at least 1-2 diapers during the first 24 hours after birth.  Wetting at least 5-6 diapers every 24 hours for the first week after birth. The urine should be clear or pale yellow by the age of 5 days.  Wetting 6-8 diapers every 24 hours as your baby continues to grow and develop.  At least 3 stools in a 24-hour period by the age of 5 days. The stool should be soft and yellow.  At least 3 stools in a 24-hour period by the age of 7 days. The stool should be seedy and yellow.  No loss of weight greater than 10% of birth weight during the first 3 days of life.  Average weight gain of 4-7 oz (113-198 g) per week after the age of 4 days.  Consistent daily weight gain by the age of 5 days, without weight loss after the age of 2 weeks. After a feeding, your baby may spit up a small amount of milk. This is normal. Breastfeeding frequency and duration Frequent feeding will help you make more milk and can prevent sore nipples and extremely full breasts (breast engorgement). Breastfeed when you feel the need to reduce the fullness of your breasts or when your baby shows signs of hunger. This is called "breastfeeding on demand." Signs that your baby is hungry include:  Increased alertness, activity, or restlessness.  Movement of the head from side to side.  Opening of the mouth when the corner of the mouth or cheek is stroked (rooting).  Increased  sucking sounds, smacking lips, cooing, sighing, or squeaking.  Hand-to-mouth movements and sucking on fingers or hands.  Fussing or crying. Avoid introducing a pacifier to your baby in the first 4-6 weeks after your baby is born. After this time, you may choose to use a pacifier. Research has shown that pacifier use during the first year of a baby's life decreases the risk of sudden infant death syndrome (SIDS). Allow your baby to feed on each breast as long as he or she wants. When your baby unlatches or falls asleep while feeding from the   first breast, offer the second breast. Because newborns are often sleepy in the first few weeks of life, you may need to awaken your baby to get him or her to feed. Breastfeeding times will vary from baby to baby. However, the following rules can serve as a guide to help you make sure that your baby is properly fed:  Newborns (babies 4 weeks of age or younger) may breastfeed every 1-3 hours.  Newborns should not go without breastfeeding for longer than 3 hours during the day or 5 hours during the night.  You should breastfeed your baby a minimum of 8 times in a 24-hour period. Breast milk pumping Pumping and storing breast milk allows you to make sure that your baby is exclusively fed your breast milk, even at times when you are unable to breastfeed. This is especially important if you go back to work while you are still breastfeeding, or if you are not able to be present during feedings. Your lactation consultant can help you find a method of pumping that works best for you and give you guidelines about how long it is safe to store breast milk.      Caring for your breasts while you breastfeed Nipples can become dry, cracked, and sore while breastfeeding. The following recommendations can help keep your breasts moisturized and healthy:  Avoid using soap on your nipples.  Wear a supportive bra designed especially for nursing. Avoid wearing underwire-style  bras or extremely tight bras (sports bras).  Air-dry your nipples for 3-4 minutes after each feeding.  Use only cotton bra pads to absorb leaked breast milk. Leaking of breast milk between feedings is normal.  Use lanolin on your nipples after breastfeeding. Lanolin helps to maintain your skin's normal moisture barrier. Pure lanolin is not harmful (not toxic) to your baby. You may also hand express a few drops of breast milk and gently massage that milk into your nipples and allow the milk to air-dry. In the first few weeks after giving birth, some women experience breast engorgement. Engorgement can make your breasts feel heavy, warm, and tender to the touch. Engorgement peaks within 3-5 days after you give birth. The following recommendations can help to ease engorgement:  Completely empty your breasts while breastfeeding or pumping. You may want to start by applying warm, moist heat (in the shower or with warm, water-soaked hand towels) just before feeding or pumping. This increases circulation and helps the milk flow. If your baby does not completely empty your breasts while breastfeeding, pump any extra milk after he or she is finished.  Apply ice packs to your breasts immediately after breastfeeding or pumping, unless this is too uncomfortable for you. To do this: ? Put ice in a plastic bag. ? Place a towel between your skin and the bag. ? Leave the ice on for 20 minutes, 2-3 times a day.  Make sure that your baby is latched on and positioned properly while breastfeeding. If engorgement persists after 48 hours of following these recommendations, contact your health care provider or a lactation consultant. Overall health care recommendations while breastfeeding  Eat 3 healthy meals and 3 snacks every day. Well-nourished mothers who are breastfeeding need an additional 450-500 calories a day. You can meet this requirement by increasing the amount of a balanced diet that you eat.  Drink  enough water to keep your urine pale yellow or clear.  Rest often, relax, and continue to take your prenatal vitamins to prevent fatigue, stress, and low   vitamin and mineral levels in your body (nutrient deficiencies).  Do not use any products that contain nicotine or tobacco, such as cigarettes and e-cigarettes. Your baby may be harmed by chemicals from cigarettes that pass into breast milk and exposure to secondhand smoke. If you need help quitting, ask your health care provider.  Avoid alcohol.  Do not use illegal drugs or marijuana.  Talk with your health care provider before taking any medicines. These include over-the-counter and prescription medicines as well as vitamins and herbal supplements. Some medicines that may be harmful to your baby can pass through breast milk.  It is possible to become pregnant while breastfeeding. If birth control is desired, ask your health care provider about options that will be safe while breastfeeding your baby. Where to find more information: [employer]: www.llli.org Contact a health care provider if:  You feel like you want to stop breastfeeding or have become frustrated with breastfeeding.  Your nipples are cracked or bleeding.  Your breasts are red, tender, or warm.  You have: ? Painful breasts or nipples. ? A swollen area on either breast. ? A fever or chills. ? Nausea or vomiting. ? Drainage other than breast milk from your nipples.  Your breasts do not become full before feedings by the fifth day after you give birth.  You feel sad and depressed.  Your baby is: ? Too sleepy to eat well. ? Having trouble sleeping. ? More than 1 week old and wetting fewer than 6 diapers in a 24-hour period. ? Not gaining weight by 5 days of age.  Your baby has fewer than 3 stools in a 24-hour period.  Your baby's skin or the white parts of his or her eyes become yellow. Get help right away if:  Your baby is overly tired  (lethargic) and does not want to wake up and feed.  Your baby develops an unexplained fever. Summary  Breastfeeding offers many health benefits for infant and mothers.  Try to breastfeed your infant when he or she shows early signs of hunger.  Gently tickle or stroke your baby's lips with your finger or nipple to allow the baby to open his or her mouth. Bring the baby to your breast. Make sure that much of the areola is in your baby's mouth. Offer one side and burp the baby before you offer the other side.  Talk with your health care provider or lactation consultant if you have questions or you face problems as you breastfeed. This information is not intended to replace advice given to you by your health care provider. Make sure you discuss any questions you have with your health care provider. Document Revised: 02/19/2018 Document Reviewed: 12/27/2016 Elsevier Patient Education  2021 Elsevier Inc.  

## 2021-05-30 ENCOUNTER — Other Ambulatory Visit: Payer: Self-pay

## 2021-05-30 ENCOUNTER — Encounter: Payer: Self-pay | Admitting: *Deleted

## 2021-05-30 ENCOUNTER — Ambulatory Visit: Payer: Medicaid Other | Admitting: *Deleted

## 2021-05-30 ENCOUNTER — Ambulatory Visit: Payer: Medicaid Other | Attending: Obstetrics and Gynecology

## 2021-05-30 ENCOUNTER — Other Ambulatory Visit: Payer: Self-pay | Admitting: *Deleted

## 2021-05-30 VITALS — BP 95/60 | HR 85

## 2021-05-30 DIAGNOSIS — O09213 Supervision of pregnancy with history of pre-term labor, third trimester: Secondary | ICD-10-CM | POA: Diagnosis not present

## 2021-05-30 DIAGNOSIS — O352XX0 Maternal care for (suspected) hereditary disease in fetus, not applicable or unspecified: Secondary | ICD-10-CM

## 2021-05-30 DIAGNOSIS — Z3A27 27 weeks gestation of pregnancy: Secondary | ICD-10-CM

## 2021-05-30 DIAGNOSIS — O283 Abnormal ultrasonic finding on antenatal screening of mother: Secondary | ICD-10-CM | POA: Insufficient documentation

## 2021-05-30 DIAGNOSIS — Z3480 Encounter for supervision of other normal pregnancy, unspecified trimester: Secondary | ICD-10-CM

## 2021-05-30 DIAGNOSIS — Z8751 Personal history of pre-term labor: Secondary | ICD-10-CM | POA: Diagnosis present

## 2021-05-30 DIAGNOSIS — Z148 Genetic carrier of other disease: Secondary | ICD-10-CM

## 2021-05-30 DIAGNOSIS — O3662X0 Maternal care for excessive fetal growth, second trimester, not applicable or unspecified: Secondary | ICD-10-CM

## 2021-05-30 DIAGNOSIS — O3663X0 Maternal care for excessive fetal growth, third trimester, not applicable or unspecified: Secondary | ICD-10-CM

## 2021-05-30 DIAGNOSIS — Z8759 Personal history of other complications of pregnancy, childbirth and the puerperium: Secondary | ICD-10-CM | POA: Diagnosis present

## 2021-06-01 ENCOUNTER — Ambulatory Visit (INDEPENDENT_AMBULATORY_CARE_PROVIDER_SITE_OTHER): Payer: Medicaid Other | Admitting: Family Medicine

## 2021-06-01 ENCOUNTER — Other Ambulatory Visit: Payer: Self-pay

## 2021-06-01 VITALS — BP 105/63 | HR 95 | Wt 169.0 lb

## 2021-06-01 DIAGNOSIS — D563 Thalassemia minor: Secondary | ICD-10-CM

## 2021-06-01 DIAGNOSIS — Z3480 Encounter for supervision of other normal pregnancy, unspecified trimester: Secondary | ICD-10-CM

## 2021-06-01 DIAGNOSIS — O99830 Other infection carrier state complicating pregnancy: Secondary | ICD-10-CM

## 2021-06-01 DIAGNOSIS — Z23 Encounter for immunization: Secondary | ICD-10-CM

## 2021-06-01 DIAGNOSIS — Z8751 Personal history of pre-term labor: Secondary | ICD-10-CM

## 2021-06-01 DIAGNOSIS — O24419 Gestational diabetes mellitus in pregnancy, unspecified control: Secondary | ICD-10-CM

## 2021-06-01 DIAGNOSIS — O24410 Gestational diabetes mellitus in pregnancy, diet controlled: Secondary | ICD-10-CM

## 2021-06-01 DIAGNOSIS — Z3A28 28 weeks gestation of pregnancy: Secondary | ICD-10-CM

## 2021-06-01 DIAGNOSIS — Z8759 Personal history of other complications of pregnancy, childbirth and the puerperium: Secondary | ICD-10-CM

## 2021-06-01 NOTE — Progress Notes (Signed)
   PRENATAL VISIT NOTE  Subjective:  Abigail Davidson is a 35 y.o. G3P1102 at [redacted]w[redacted]d being seen today for ongoing prenatal care.  She is currently monitored for the following issues for this high-risk pregnancy and has Supervision of other normal pregnancy, antepartum; History of preterm delivery; History of pre-eclampsia; and Alpha thalassemia silent carrier on their problem list.  Patient reports no complaints.  Contractions: Irritability. Vag. Bleeding: None.  Movement: Present. Denies leaking of fluid.   The following portions of the patient's history were reviewed and updated as appropriate: allergies, current medications, past family history, past medical history, past social history, past surgical history and problem list.   Objective:   Vitals:   06/01/21 0911  BP: 105/63  Pulse: 95  Weight: 169 lb (76.7 kg)    Fetal Status: Fetal Heart Rate (bpm): 143 Fundal Height: 29 cm Movement: Present     General:  Alert, oriented and cooperative. Patient is in no acute distress.  Skin: Skin is warm and dry. No rash noted.   Cardiovascular: Normal heart rate noted  Respiratory: Normal respiratory effort, no problems with respiration noted  Abdomen: Soft, gravid, appropriate for gestational age.  Pain/Pressure: Present     Pelvic: Cervical exam deferred        Extremities: Normal range of motion.  Edema: Trace  Mental Status: Normal mood and affect. Normal behavior. Normal judgment and thought content.   Assessment and Plan:  Pregnancy: G3P1102 at [redacted]w[redacted]d  1. [redacted] weeks gestation of pregnancy - CBC - Glucose Tolerance, 2 Hours w/1 Hour - HIV Antibody (routine testing w rflx) - RPR - Tdap vaccine greater than or equal to 7yo IM  2. Supervision of other normal pregnancy, antepartum FHT and FH normal EFW 95% 2hr GTT today  3. Alpha thalassemia silent carrier  4. History of preterm delivery On prometrium  5. History of pre-eclampsia On ASA 81mg  BP normal    Preterm  labor symptoms and general obstetric precautions including but not limited to vaginal bleeding, contractions, leaking of fluid and fetal movement were reviewed in detail with the patient. Please refer to After Visit Summary for other counseling recommendations.   No follow-ups on file.  Future Appointments  Date Time Provider Department Center  06/14/2021  1:00 PM 08/15/2021, DO [facility] None  06/27/2021 10:00 AM [facility] NURSE [facility]  06/27/2021 10:15 AM [facility] US2 [facility]  06/28/2021  9:15 AM 06/30/2021, DO [facility] None  07/12/2021 10:15 AM 09/11/2021, MD [facility] None  07/26/2021 10:15 AM 07/28/2021, DO [facility] None  08/02/2021 10:55 AM 08/04/2021, DO [facility] None  08/09/2021 10:15 AM 10/09/2021, DO [facility] None    Levie Heritage, DO

## 2021-06-02 LAB — RPR: RPR Ser Ql: NONREACTIVE

## 2021-06-02 LAB — GLUCOSE TOLERANCE, 2 HOURS W/ 1HR
Glucose, 1 hour: 156 mg/dL (ref 65–179)
Glucose, 2 hour: 124 mg/dL (ref 65–152)
Glucose, Fasting: 99 mg/dL — ABNORMAL HIGH (ref 65–91)

## 2021-06-02 LAB — CBC
Hematocrit: 29.7 % — ABNORMAL LOW (ref 34.0–46.6)
Hemoglobin: 9.7 g/dL — ABNORMAL LOW (ref 11.1–15.9)
MCH: 24.5 pg — ABNORMAL LOW (ref 26.6–33.0)
MCHC: 32.7 g/dL (ref 31.5–35.7)
MCV: 75 fL — ABNORMAL LOW (ref 79–97)
Platelets: 261 10*3/uL (ref 150–450)
RBC: 3.96 x10E6/uL (ref 3.77–5.28)
RDW: 13.2 % (ref 11.7–15.4)
WBC: 9.6 10*3/uL (ref 3.4–10.8)

## 2021-06-02 LAB — HIV ANTIBODY (ROUTINE TESTING W REFLEX): HIV Screen 4th Generation wRfx: NONREACTIVE

## 2021-06-05 ENCOUNTER — Telehealth: Payer: Self-pay

## 2021-06-05 ENCOUNTER — Other Ambulatory Visit: Payer: Self-pay | Admitting: Family Medicine

## 2021-06-05 DIAGNOSIS — O24410 Gestational diabetes mellitus in pregnancy, diet controlled: Secondary | ICD-10-CM

## 2021-06-05 DIAGNOSIS — D509 Iron deficiency anemia, unspecified: Secondary | ICD-10-CM

## 2021-06-05 DIAGNOSIS — O24419 Gestational diabetes mellitus in pregnancy, unspecified control: Secondary | ICD-10-CM | POA: Insufficient documentation

## 2021-06-05 MED ORDER — ACCU-CHEK NANO SMARTVIEW W/DEVICE KIT: 1.0000 | PACK | 0 refills | Status: DC

## 2021-06-05 MED ORDER — ACCU-CHEK SOFTCLIX LANCETS MISC: 1.0000 | Freq: Four times a day (QID) | 12 refills | Status: DC

## 2021-06-05 MED ORDER — GLUCOSE BLOOD VI STRP: ORAL_STRIP | 12 refills | Status: DC

## 2021-06-05 NOTE — Telephone Encounter (Signed)
-----   Message from Levie Heritage, DO sent at 06/05/2021 12:52 PM EDT ----- Needs to be set up with iron infusions at infusion center - venofer 300mg  weekly x 2 infusions

## 2021-06-05 NOTE — Addendum Note (Signed)
Addended by: Levie Heritage on: 06/05/2021 12:51 PM   Modules accepted: Orders

## 2021-06-05 NOTE — Telephone Encounter (Signed)
Called pt to discuss results. Pt made aware that she is anemic and she will need to have two iron infusions. Pt also made aware that she has Gestational Diabetes and will need to have to go to Diabetes Education class. Understanding was voiced. Deandrae Wajda l Tarita Deshmukh, CMA

## 2021-06-14 ENCOUNTER — Ambulatory Visit (INDEPENDENT_AMBULATORY_CARE_PROVIDER_SITE_OTHER): Payer: Medicaid Other | Admitting: Family Medicine

## 2021-06-14 ENCOUNTER — Other Ambulatory Visit: Payer: Self-pay

## 2021-06-14 VITALS — BP 108/68 | HR 110 | Wt 169.0 lb

## 2021-06-14 DIAGNOSIS — Z3A30 30 weeks gestation of pregnancy: Secondary | ICD-10-CM

## 2021-06-14 DIAGNOSIS — Z8759 Personal history of other complications of pregnancy, childbirth and the puerperium: Secondary | ICD-10-CM

## 2021-06-14 DIAGNOSIS — Z8751 Personal history of pre-term labor: Secondary | ICD-10-CM

## 2021-06-14 DIAGNOSIS — R519 Headache, unspecified: Secondary | ICD-10-CM

## 2021-06-14 DIAGNOSIS — O24410 Gestational diabetes mellitus in pregnancy, diet controlled: Secondary | ICD-10-CM

## 2021-06-14 DIAGNOSIS — Z3480 Encounter for supervision of other normal pregnancy, unspecified trimester: Secondary | ICD-10-CM

## 2021-06-14 NOTE — Progress Notes (Signed)
   PRENATAL VISIT NOTE  Subjective:  Abigail Davidson is a 35 y.o. G3P1102 at 93w0dbeing seen today for ongoing prenatal care.  She is currently monitored for the following issues for this high-risk pregnancy and has Supervision of other normal pregnancy, antepartum; History of preterm delivery; History of pre-eclampsia; Alpha thalassemia silent carrier; and Gestational diabetes mellitus (GDM) in third trimester on their problem list.  Patient reports headache daily when she first wakes up. Stays for about an hour or two, then goes away. Gets nauseated and has scotoma during this time. Does not take tylenol or anything for it.  Contractions: Irritability. Vag. Bleeding: None.  Movement: Present. Denies leaking of fluid.   The following portions of the patient's history were reviewed and updated as appropriate: allergies, current medications, past family history, past medical history, past social history, past surgical history and problem list.   Objective:   Vitals:   06/14/21 1309  BP: 108/68  Pulse: (!) 110  Weight: 169 lb (76.7 kg)    Fetal Status: Fetal Heart Rate (bpm): 135   Movement: Present     General:  Alert, oriented and cooperative. Patient is in no acute distress.  Skin: Skin is warm and dry. No rash noted.   Cardiovascular: Normal heart rate noted  Respiratory: Normal respiratory effort, no problems with respiration noted  Abdomen: Soft, gravid, appropriate for gestational age.  Pain/Pressure: Present     Pelvic: Cervical exam deferred        Extremities: Normal range of motion.  Edema: Trace  Mental Status: Normal mood and affect. Normal behavior. Normal judgment and thought content.   Assessment and Plan:  Pregnancy: G3P1102 at 377w0d. [redacted] weeks gestation of pregnancy  2. Supervision of other normal pregnancy, antepartum FHT and FH normal  3. Diet controlled gestational diabetes mellitus (GDM) in third trimester Hasn't been writing down CBGs - but reports  as controlled  4. History of pre-eclampsia  5. History of preterm delivery On prometrium  6. Daily headache Uncertain of the etiology - check CMP and CBC. Doubtful preeclampsia as BP normal. May need MRI. It is also possible that the headaches are from anemia. - Comp Met (CMET) - CBC - Protein / creatinine ratio, urine  Preterm labor symptoms and general obstetric precautions including but not limited to vaginal bleeding, contractions, leaking of fluid and fetal movement were reviewed in detail with the patient. Please refer to After Visit Summary for other counseling recommendations.   No follow-ups on file.  Future Appointments  Date Time Provider De[place]cean Breeze7/11/2021  9:00 AM MCINF-RM1 [facility] None  06/21/2021  9:15 AM WMC-EDUCATION [facility]/20/2022 10:00 AM [facility] NURSE [facility]7/20/2022 10:15 AM WMC-MFC US2 [facility]7/21/2022  9:15 AM StTruett MainlandDO [facility] None  07/12/2021 10:15 AM DaSloan LeiterMD [facility] None  07/26/2021 10:15 AM StTruett MainlandDO [facility] None  08/02/2021 10:55 AM StTruett MainlandDO [facility] None  08/09/2021 10:15 AM StTruett MainlandDO [facility] None    JaTruett MainlandDO

## 2021-06-15 LAB — CBC
Hematocrit: 29.8 % — ABNORMAL LOW (ref 34.0–46.6)
Hemoglobin: 9.4 g/dL — ABNORMAL LOW (ref 11.1–15.9)
MCH: 23.8 pg — ABNORMAL LOW (ref 26.6–33.0)
MCHC: 31.5 g/dL (ref 31.5–35.7)
MCV: 75 fL — ABNORMAL LOW (ref 79–97)
Platelets: 295 10*3/uL (ref 150–450)
RBC: 3.95 x10E6/uL (ref 3.77–5.28)
RDW: 13.5 % (ref 11.7–15.4)
WBC: 9.8 10*3/uL (ref 3.4–10.8)

## 2021-06-15 LAB — COMPREHENSIVE METABOLIC PANEL
ALT: 16 IU/L (ref 0–32)
AST: 15 IU/L (ref 0–40)
Albumin/Globulin Ratio: 1.1 — ABNORMAL LOW (ref 1.2–2.2)
Albumin: 3.4 g/dL — ABNORMAL LOW (ref 3.8–4.8)
Alkaline Phosphatase: 169 IU/L — ABNORMAL HIGH (ref 44–121)
BUN/Creatinine Ratio: 10 (ref 9–23)
BUN: 6 mg/dL (ref 6–20)
Bilirubin Total: <0.2 mg/dL (ref 0.0–1.2)
CO2: 19 mmol/L — ABNORMAL LOW (ref 20–29)
Calcium: 8.6 mg/dL — ABNORMAL LOW (ref 8.7–10.2)
Chloride: 100 mmol/L (ref 96–106)
Creatinine, Ser: 0.61 mg/dL (ref 0.57–1.00)
Globulin, Total: 3.1 g/dL (ref 1.5–4.5)
Glucose: 111 mg/dL — ABNORMAL HIGH (ref 65–99)
Potassium: 3.8 mmol/L (ref 3.5–5.2)
Sodium: 136 mmol/L (ref 134–144)
Total Protein: 6.5 g/dL (ref 6.0–8.5)
eGFR: 121 mL/min/{1.73_m2} (ref >59)

## 2021-06-19 ENCOUNTER — Encounter (HOSPITAL_COMMUNITY)
Admission: RE | Admit: 2021-06-19 | Discharge: 2021-06-19 | Disposition: A | Discharge status: Home / Self Care | Payer: Medicaid Other | Source: Ambulatory Visit | Attending: Family Medicine | Admitting: Family Medicine

## 2021-06-19 ENCOUNTER — Other Ambulatory Visit: Payer: Self-pay

## 2021-06-19 DIAGNOSIS — D509 Iron deficiency anemia, unspecified: Secondary | ICD-10-CM | POA: Diagnosis present

## 2021-06-19 DIAGNOSIS — O99019 Anemia complicating pregnancy, unspecified trimester: Secondary | ICD-10-CM | POA: Diagnosis present

## 2021-06-19 MED ORDER — ALBUTEROL SULFATE (2.5 MG/3ML) 0.083% IN NEBU
2.5000 mg | INHALATION_SOLUTION | Freq: Once | RESPIRATORY_TRACT | Status: DC | PRN
Start: 2021-06-19 — End: 2021-06-20

## 2021-06-19 MED ORDER — SODIUM CHLORIDE 0.9 % IV SOLN
INTRAVENOUS | Status: DC | PRN
Start: 2021-06-19 — End: 2021-06-20

## 2021-06-19 MED ORDER — SODIUM CHLORIDE 0.9 % IV SOLN
300.0000 mg | INTRAVENOUS | Status: DC
  Administered 2021-06-19: 300 mg via INTRAVENOUS
  Filled 2021-06-19: qty 15

## 2021-06-19 MED ORDER — SODIUM CHLORIDE 0.9 % IV BOLUS: 500.0000 mL | Freq: Once | INTRAVENOUS | Status: DC | PRN

## 2021-06-19 MED ORDER — METHYLPREDNISOLONE SODIUM SUCC 125 MG IJ SOLR: 125.0000 mg | Freq: Once | INTRAMUSCULAR | Status: DC | PRN

## 2021-06-19 MED ORDER — DIPHENHYDRAMINE HCL 50 MG/ML IJ SOLN: 25.0000 mg | Freq: Once | INTRAMUSCULAR | Status: DC | PRN

## 2021-06-19 MED ORDER — EPINEPHRINE PF 1 MG/ML IJ SOLN
0.3000 mg | Freq: Once | INTRAMUSCULAR | Status: DC | PRN
Start: 2021-06-19 — End: 2021-06-20

## 2021-06-21 ENCOUNTER — Ambulatory Visit: Payer: Medicaid Other | Admitting: Registered"

## 2021-06-21 ENCOUNTER — Other Ambulatory Visit: Payer: Self-pay

## 2021-06-21 ENCOUNTER — Encounter: Payer: Medicaid Other | Attending: Family Medicine | Admitting: Registered"

## 2021-06-21 DIAGNOSIS — O24419 Gestational diabetes mellitus in pregnancy, unspecified control: Secondary | ICD-10-CM | POA: Insufficient documentation

## 2021-06-21 DIAGNOSIS — Z3A00 Weeks of gestation of pregnancy not specified: Secondary | ICD-10-CM | POA: Diagnosis not present

## 2021-06-21 NOTE — Progress Notes (Signed)
Patient was seen for Gestational Diabetes self-management on 06/21/21  Start time 0915 and End time 0945   Estimated due date: 08/23/21; [redacted]w[redacted]d  Clinical: Medications: prenatal, aspirin Medical History: Hx pre-eclampsia Labs: OGTT elevated FBS, A1c 5.5% 02/06/21  Dietary and Lifestyle History: Patient has a lot of exposure to people with diabetes including many family members, some of whom work in health care. Patient has made dietary changes after learning about the GDM diagnosis and appears to have good control of blood sugar.  Physical Activity: ADLs Stress: 0/10 Sleep: ~4 hrs, due to pain (contractions)  24 hr Recall: First Meal: banana, waffles, 1/2 orange juice Snack: none Second meal: quesedilla OR sandwich wheat bread, ham & cheese Snack: fruit Third meal: vegetables, grilled chicken baked potato or ~1/2 cup rice Snack: fruit  Beverages: water, a little juice  NUTRITION INTERVENTION  Nutrition education (E-1) on the following topics:   Initial Follow-up  []  []  Definition of Gestational Diabetes []  []  Why dietary management is important in controlling blood glucose [x]  []  Effects each nutrient has on blood glucose levels []  []  Simple carbohydrates vs complex carbohydrates [x]  []  Fluid intake [x]  []  Creating a balanced meal plan [x]  []  Carbohydrate counting  [x]  []  When to check blood glucose levels [x]  []  Proper blood glucose monitoring techniques [x]  []  Effect of stress and stress reduction techniques  [x]  []  Exercise effect on blood glucose levels, appropriate exercise during pregnancy [x]  []  Importance of limiting caffeine and abstaining from alcohol and smoking [x]  []  Medications used for blood sugar control during pregnancy [x]  []  Hypoglycemia and rule of 15 [x]  []  Postpartum self care  Patient already has a meter, is testing pre breakfast and 2 hours after each meal. Patient reported readings are FBS: 88-90 mg/dL Postprandial: mg/dL  Patient  instructed to monitor glucose levels: FBS: 60 - ? 95 mg/dL (some clinics use 90 for cutoff) 1 hour: ? 140 mg/dL 2 hour: ? mg/dL  Patient received handouts: Nutrition Diabetes and Pregnancy Carbohydrate Counting List  Patient will be seen for follow-up as needed.

## 2021-06-25 ENCOUNTER — Other Ambulatory Visit: Payer: Self-pay

## 2021-06-26 ENCOUNTER — Encounter (HOSPITAL_COMMUNITY)
Admission: RE | Admit: 2021-06-26 | Discharge: 2021-06-26 | Disposition: A | Discharge status: Home / Self Care | Payer: Medicaid Other | Source: Ambulatory Visit | Attending: Family Medicine | Admitting: Family Medicine

## 2021-06-26 ENCOUNTER — Other Ambulatory Visit: Payer: Self-pay

## 2021-06-26 DIAGNOSIS — O99019 Anemia complicating pregnancy, unspecified trimester: Secondary | ICD-10-CM | POA: Diagnosis not present

## 2021-06-26 DIAGNOSIS — D509 Iron deficiency anemia, unspecified: Secondary | ICD-10-CM

## 2021-06-26 MED ORDER — DIPHENHYDRAMINE HCL 50 MG/ML IJ SOLN: 25.0000 mg | Freq: Once | INTRAMUSCULAR | Status: DC | PRN

## 2021-06-26 MED ORDER — SODIUM CHLORIDE 0.9 % IV SOLN
300.0000 mg | INTRAVENOUS | Status: DC
  Administered 2021-06-26: 300 mg via INTRAVENOUS
  Filled 2021-06-26: qty 15

## 2021-06-26 MED ORDER — ALBUTEROL SULFATE (2.5 MG/3ML) 0.083% IN NEBU
2.5000 mg | INHALATION_SOLUTION | Freq: Once | RESPIRATORY_TRACT | Status: DC | PRN
Start: 2021-06-26 — End: 2021-06-27

## 2021-06-26 MED ORDER — METHYLPREDNISOLONE SODIUM SUCC 125 MG IJ SOLR: 125.0000 mg | Freq: Once | INTRAMUSCULAR | Status: DC | PRN

## 2021-06-26 MED ORDER — EPINEPHRINE PF 1 MG/ML IJ SOLN
0.3000 mg | Freq: Once | INTRAMUSCULAR | Status: DC | PRN
Start: 2021-06-26 — End: 2021-06-27

## 2021-06-26 MED ORDER — SODIUM CHLORIDE 0.9 % IV SOLN
INTRAVENOUS | Status: DC | PRN
Start: 2021-06-26 — End: 2021-06-27

## 2021-06-26 MED ORDER — SODIUM CHLORIDE 0.9 % IV BOLUS
500.0000 mL | Freq: Once | INTRAVENOUS | Status: DC | PRN
Start: 2021-06-26 — End: 2021-06-27

## 2021-06-27 ENCOUNTER — Ambulatory Visit: Payer: Medicaid Other | Attending: Obstetrics and Gynecology

## 2021-06-27 ENCOUNTER — Encounter: Payer: Self-pay | Admitting: *Deleted

## 2021-06-27 ENCOUNTER — Other Ambulatory Visit: Payer: Self-pay | Admitting: Obstetrics and Gynecology

## 2021-06-27 ENCOUNTER — Ambulatory Visit: Payer: Medicaid Other | Admitting: *Deleted

## 2021-06-27 VITALS — BP 108/61 | HR 91

## 2021-06-27 DIAGNOSIS — O24410 Gestational diabetes mellitus in pregnancy, diet controlled: Secondary | ICD-10-CM

## 2021-06-27 DIAGNOSIS — Z3480 Encounter for supervision of other normal pregnancy, unspecified trimester: Secondary | ICD-10-CM | POA: Diagnosis present

## 2021-06-27 DIAGNOSIS — Z148 Genetic carrier of other disease: Secondary | ICD-10-CM

## 2021-06-27 DIAGNOSIS — O09213 Supervision of pregnancy with history of pre-term labor, third trimester: Secondary | ICD-10-CM

## 2021-06-27 DIAGNOSIS — O09299 Supervision of pregnancy with other poor reproductive or obstetric history, unspecified trimester: Secondary | ICD-10-CM

## 2021-06-27 DIAGNOSIS — O352XX0 Maternal care for (suspected) hereditary disease in fetus, not applicable or unspecified: Secondary | ICD-10-CM

## 2021-06-27 DIAGNOSIS — Z3A31 31 weeks gestation of pregnancy: Secondary | ICD-10-CM

## 2021-06-27 DIAGNOSIS — O3663X0 Maternal care for excessive fetal growth, third trimester, not applicable or unspecified: Secondary | ICD-10-CM | POA: Diagnosis not present

## 2021-06-27 DIAGNOSIS — O3662X0 Maternal care for excessive fetal growth, second trimester, not applicable or unspecified: Secondary | ICD-10-CM

## 2021-06-27 DIAGNOSIS — O3660X0 Maternal care for excessive fetal growth, unspecified trimester, not applicable or unspecified: Secondary | ICD-10-CM

## 2021-06-27 DIAGNOSIS — Z8751 Personal history of pre-term labor: Secondary | ICD-10-CM

## 2021-06-27 DIAGNOSIS — Z8759 Personal history of other complications of pregnancy, childbirth and the puerperium: Secondary | ICD-10-CM

## 2021-06-28 ENCOUNTER — Ambulatory Visit (INDEPENDENT_AMBULATORY_CARE_PROVIDER_SITE_OTHER): Payer: Medicaid Other | Admitting: Family Medicine

## 2021-06-28 ENCOUNTER — Other Ambulatory Visit: Payer: Self-pay

## 2021-06-28 VITALS — BP 99/63 | HR 99 | Wt 172.0 lb

## 2021-06-28 DIAGNOSIS — Z3480 Encounter for supervision of other normal pregnancy, unspecified trimester: Secondary | ICD-10-CM

## 2021-06-28 DIAGNOSIS — D563 Thalassemia minor: Secondary | ICD-10-CM

## 2021-06-28 DIAGNOSIS — Z8751 Personal history of pre-term labor: Secondary | ICD-10-CM

## 2021-06-28 DIAGNOSIS — O24419 Gestational diabetes mellitus in pregnancy, unspecified control: Secondary | ICD-10-CM

## 2021-06-28 DIAGNOSIS — Z8759 Personal history of other complications of pregnancy, childbirth and the puerperium: Secondary | ICD-10-CM

## 2021-06-28 NOTE — Progress Notes (Signed)
Patient did not bring her blood sugar log today.

## 2021-06-28 NOTE — Progress Notes (Signed)
   PRENATAL VISIT NOTE  Subjective:  Abigail Davidson is a 35 y.o. G3P1102 at [redacted]w[redacted]d being seen today for ongoing prenatal care.  She is currently monitored for the following issues for this high-risk pregnancy and has Supervision of other normal pregnancy, antepartum; History of preterm delivery; History of pre-eclampsia; Alpha thalassemia silent carrier; and Gestational diabetes mellitus (GDM) in third trimester on their problem list.  Patient reports occasional contractions.  Contractions: Irritability. Vag. Bleeding: None.  Movement: Present. Denies leaking of fluid.   The following portions of the patient's history were reviewed and updated as appropriate: allergies, current medications, past family history, past medical history, past social history, past surgical history and problem list.   Objective:   Vitals:   06/28/21 0908  BP: 99/63  Pulse: 99  Weight: 172 lb (78 kg)    Fetal Status: Fetal Heart Rate (bpm): 132   Movement: Present     General:  Alert, oriented and cooperative. Patient is in no acute distress.  Skin: Skin is warm and dry. No rash noted.   Cardiovascular: Normal heart rate noted  Respiratory: Normal respiratory effort, no problems with respiration noted  Abdomen: Soft, gravid, appropriate for gestational age.  Pain/Pressure: Present     Pelvic: Cervical exam deferred        Extremities: Normal range of motion.  Edema: Trace  Mental Status: Normal mood and affect. Normal behavior. Normal judgment and thought content.   Assessment and Plan:  Pregnancy: G3P1102 at [redacted]w[redacted]d 1. Supervision of other normal pregnancy, antepartum FHT and FH normal  2. Gestational diabetes mellitus (GDM) in third trimester, gestational diabetes method of control unspecified Mostly controlled - a few elevated after dinner.  Has follow up US on 8/17.  3. History of preterm delivery On prometrium (didn't tolerate makena)  4. History of pre-eclampsia On ASA 81mg   5. Alpha  thalassemia silent carrier   Preterm labor symptoms and general obstetric precautions including but not limited to vaginal bleeding, contractions, leaking of fluid and fetal movement were reviewed in detail with the patient. Please refer to After Visit Summary for other counseling recommendations.   No follow-ups on file.  Future Appointments  Date Time Provider Department Center  07/12/2021 10:15 AM 09/11/2021, MD [facility] None  07/25/2021 10:45 AM [facility] NURSE [facility]  07/25/2021 11:00 AM WMC-MFC US1 [facility]  07/26/2021 10:15 AM 07/28/2021, DO [facility] None  08/02/2021 10:55 AM 08/04/2021, DO [facility] None  08/09/2021 10:15 AM 10/09/2021, DO [facility] None    Levie Heritage, DO

## 2021-07-12 ENCOUNTER — Ambulatory Visit (INDEPENDENT_AMBULATORY_CARE_PROVIDER_SITE_OTHER): Payer: Medicaid Other | Admitting: Obstetrics and Gynecology

## 2021-07-12 ENCOUNTER — Encounter: Payer: Self-pay | Admitting: Obstetrics and Gynecology

## 2021-07-12 ENCOUNTER — Other Ambulatory Visit: Payer: Self-pay

## 2021-07-12 VITALS — BP 106/75 | HR 100 | Wt 172.0 lb

## 2021-07-12 DIAGNOSIS — Z8751 Personal history of pre-term labor: Secondary | ICD-10-CM

## 2021-07-12 DIAGNOSIS — O24419 Gestational diabetes mellitus in pregnancy, unspecified control: Secondary | ICD-10-CM

## 2021-07-12 DIAGNOSIS — Z3A34 34 weeks gestation of pregnancy: Secondary | ICD-10-CM

## 2021-07-12 DIAGNOSIS — Z8759 Personal history of other complications of pregnancy, childbirth and the puerperium: Secondary | ICD-10-CM

## 2021-07-12 DIAGNOSIS — Z3480 Encounter for supervision of other normal pregnancy, unspecified trimester: Secondary | ICD-10-CM

## 2021-07-12 NOTE — Progress Notes (Signed)
   PRENATAL VISIT NOTE  Subjective:  Abigail Davidson is a 35 y.o. G3P1102 at [redacted]w[redacted]d being seen today for ongoing prenatal care.  She is currently monitored for the following issues for this high-risk pregnancy and has Supervision of other normal pregnancy, antepartum; History of preterm delivery; History of pre-eclampsia; Alpha thalassemia silent carrier; and Gestational diabetes mellitus (GDM) in third trimester on their problem list.  Patient reports  increased pressure and occasional contractions that are getting stronger .  Contractions: Irregular. Vag. Bleeding: None.  Movement: Present. Denies leaking of fluid.   The following portions of the patient's history were reviewed and updated as appropriate: allergies, current medications, past family history, past medical history, past social history, past surgical history and problem list.   Objective:   Vitals:   07/12/21 1013  BP: 106/75  Pulse: 100  Weight: 172 lb (78 kg)    Fetal Status:     Movement: Present     General:  Alert, oriented and cooperative. Patient is in no acute distress.  Skin: Skin is warm and dry. No rash noted.   Cardiovascular: Normal heart rate noted  Respiratory: Normal respiratory effort, no problems with respiration noted  Abdomen: Soft, gravid, appropriate for gestational age.  Pain/Pressure: Present     Pelvic: Cervical exam deferred        Extremities: Normal range of motion.  Edema: Trace  Mental Status: Normal mood and affect. Normal behavior. Normal judgment and thought content.   Assessment and Plan:  Pregnancy: G3P1102 at [redacted]w[redacted]d 1. Supervision of other normal pregnancy, antepartum  2. History of preterm delivery On prometrium (did not tolerate Makena)  3. History of pre-eclampsia Cont baby ASA  4. Gestational diabetes mellitus (GDM) in third trimester, gestational diabetes method of control unspecified Diet control CBGs are within range  5. [redacted] weeks gestation of  pregnancy   Preterm labor symptoms and general obstetric precautions including but not limited to vaginal bleeding, contractions, leaking of fluid and fetal movement were reviewed in detail with the patient. Please refer to After Visit Summary for other counseling recommendations.   Return in about 2 weeks (around 07/26/2021) for high OB, in person, 36 week swabs.  Future Appointments  Date Time Provider Department Center  07/25/2021 10:45 AM WMC-MFC NURSE [facility]  07/25/2021 11:00 AM WMC-MFC US1 [facility]  07/26/2021 10:15 AM Levie Heritage, DO [facility] None  08/02/2021 10:55 AM Levie Heritage, DO [facility] None  08/09/2021 10:15 AM Adrian Blackwater Rhona Raider, DO [facility] None    Conan Bowens, MD

## 2021-07-21 ENCOUNTER — Other Ambulatory Visit: Payer: Self-pay

## 2021-07-21 ENCOUNTER — Inpatient Hospital Stay (HOSPITAL_COMMUNITY)
Admission: AD | Admit: 2021-07-21 | Discharge: 2021-07-21 | Disposition: A | Discharge status: Home / Self Care | Payer: Medicaid Other | Attending: Family Medicine | Admitting: Family Medicine

## 2021-07-21 ENCOUNTER — Encounter (HOSPITAL_COMMUNITY): Payer: Self-pay | Admitting: Family Medicine

## 2021-07-21 DIAGNOSIS — O4703 False labor before 37 completed weeks of gestation, third trimester: Secondary | ICD-10-CM | POA: Diagnosis not present

## 2021-07-21 DIAGNOSIS — Z87891 Personal history of nicotine dependence: Secondary | ICD-10-CM | POA: Diagnosis not present

## 2021-07-21 DIAGNOSIS — O26893 Other specified pregnancy related conditions, third trimester: Secondary | ICD-10-CM | POA: Insufficient documentation

## 2021-07-21 DIAGNOSIS — Z3A35 35 weeks gestation of pregnancy: Secondary | ICD-10-CM | POA: Diagnosis not present

## 2021-07-21 DIAGNOSIS — M549 Dorsalgia, unspecified: Secondary | ICD-10-CM | POA: Insufficient documentation

## 2021-07-21 DIAGNOSIS — Z79899 Other long term (current) drug therapy: Secondary | ICD-10-CM | POA: Diagnosis not present

## 2021-07-21 DIAGNOSIS — Z8751 Personal history of pre-term labor: Secondary | ICD-10-CM

## 2021-07-21 DIAGNOSIS — Z3480 Encounter for supervision of other normal pregnancy, unspecified trimester: Secondary | ICD-10-CM

## 2021-07-21 DIAGNOSIS — Z8759 Personal history of other complications of pregnancy, childbirth and the puerperium: Secondary | ICD-10-CM

## 2021-07-21 LAB — URINALYSIS, ROUTINE W REFLEX MICROSCOPIC
Bacteria, UA: NONE SEEN
Bilirubin Urine: NEGATIVE
Glucose, UA: NEGATIVE mg/dL
Hgb urine dipstick: NEGATIVE
Ketones, ur: 20 mg/dL — AB
Leukocytes,Ua: NEGATIVE
Nitrite: NEGATIVE
Protein, ur: 30 mg/dL — AB
Specific Gravity, Urine: 1.014 (ref 1.005–1.030)
pH: 6 (ref 5.0–8.0)

## 2021-07-21 NOTE — MAU Note (Signed)
Presents with c/o ctxs, back pain, and pelvic pressure that began this morning @ 0140 this morning.  Denies VB, unsure if LOF.  States hasn't had a gush of fluid but has had leaking.  Endorses +FM.

## 2021-07-21 NOTE — Discharge Instructions (Signed)
Return to MAU for stronger contractions that are more than 6 per hour.

## 2021-07-21 NOTE — MAU Provider Note (Signed)
History     CSN: [id number]  Arrival date and time: 07/21/21 1404   Event Date/Time   First Provider Initiated Contact with Patient 07/21/21 1444      Chief Complaint  Patient presents with   Contractions   Pelvic Pressure   Back Pain   Ms. Rasheena Talmadge is a 35 y.o. year old G12P1102 female at 48w2dweeks gestation who presents to MAU reporting contractions, back pain and pelvic pressure. She denies vaginal bleeding. She denies gushing fluid, but reports "has had some" leaking. She endorses (+) FM. [facility] (Dr. SNehemiah Settle; next appt 07/26/2021.   OB History     Gravida  3   Para  2   Term  1   Preterm  1   AB      Living  2      SAB      IAB      Ectopic      Multiple      Live Births  2           Past Medical History:  Diagnosis Date   Pregnancy induced hypertension    Preterm labor     No past surgical history on file.  Family History  Problem Relation Age of Onset   Emphysema Mother    Parkinson's disease Father     Social History   Tobacco Use   Smoking status: Former   Smokeless tobacco: Never  VScientific laboratory technicianUse: Never used  Substance Use Topics   Alcohol use: Never   Drug use: Never    Allergies: No Known Allergies  Medications Prior to Admission  Medication Sig Dispense Refill Last Dose   Accu-Chek Softclix Lancets lancets 1 each by Other route 4 (four) times daily. Use as instructed 100 each 12    aspirin EC 81 MG tablet Take 1 tablet (81 mg total) by mouth daily. Take after 12 weeks for prevention of preeclampsia later in pregnancy 300 tablet 1    Blood Glucose Monitoring Suppl (ACCU-CHEK NANO SMARTVIEW) w/Device KIT 1 kit by Subdermal route as directed. Check blood sugars for fasting, and two hours after breakfast, lunch and dinner (4 checks daily) 1 kit 0    glucose blood test strip Use as instructed 100 each 12    Prenatal Vit-Fe Fumarate-FA (PRENATAL VITAMINS) 28-0.8 MG TABS Take 1 tablet  by mouth daily. 30 tablet 11    progesterone (PROMETRIUM) 200 MG capsule Take 1 capsule (200 mg total) by mouth daily. 90 capsule 3     Review of Systems  Constitutional: Negative.   HENT: Negative.    Eyes: Negative.   Respiratory: Negative.    Cardiovascular: Negative.   Gastrointestinal: Negative.   Endocrine: Negative.   Genitourinary:  Positive for pelvic pain (pressure; felt urge to push upon arrival to MAU).  Musculoskeletal: Negative.   Skin: Negative.   Allergic/Immunologic: Negative.   Neurological: Negative.   Hematological: Negative.   Psychiatric/Behavioral: Negative.    Physical Exam   Blood pressure 120/82, pulse 97, temperature 98 F (36.7 C), temperature source Oral, resp. rate 18, height 4' 11"  (1.499 m), weight 78.8 kg, last menstrual period 11/07/2020, SpO2 100 %.  Physical Exam Vitals and nursing note reviewed. Exam conducted with a chaperone present.  Constitutional:      Appearance: Normal appearance.  Cardiovascular:     Rate and Rhythm: Normal rate.  Abdominal:     Palpations: Abdomen is soft.  Genitourinary:  General: Normal vulva.     Comments: Dilation: Closed Effacement (%): 40 Cervical Position: Posterior Station: Ballotable Presentation: Vertex Exam by:: Sunday Corn, CNM Skin:    General: Skin is warm and dry.  Neurological:     Mental Status: She is alert and oriented to person, place, and time.  Psychiatric:        Mood and Affect: Mood normal.        Behavior: Behavior normal.        Thought Content: Thought content normal.        Judgment: Judgment normal.   REACTIVE NST - FHR: 130 bpm / moderate variability / accels present / decels absent / TOCO: irregular every 2-5 mins   *Reassessment @ 1736: cervix unchanged after 2.5 hours observation  MAU Course  Procedures  MDM Cervical exam  Assessment and Plan  False labor before 37 completed weeks of gestation in third trimester -  - Discussed no cervical change in over 2.5  hours defines false labor - Information provided on BH ctxs, PTL, preventing PTB   [redacted] weeks gestation of pregnancy   - Discharge patient - Keep scheduled appt with [facility] on 07/26/21 - Patient verbalized an understanding of the plan of care and agrees.   Laury Deep, CNM 07/21/2021, 2:44 PM

## 2021-07-25 ENCOUNTER — Other Ambulatory Visit: Payer: Self-pay

## 2021-07-25 ENCOUNTER — Ambulatory Visit: Payer: Medicaid Other | Admitting: *Deleted

## 2021-07-25 ENCOUNTER — Ambulatory Visit: Payer: Medicaid Other | Attending: Obstetrics and Gynecology

## 2021-07-25 ENCOUNTER — Encounter: Payer: Self-pay | Admitting: *Deleted

## 2021-07-25 VITALS — BP 105/67 | HR 95

## 2021-07-25 DIAGNOSIS — O09299 Supervision of pregnancy with other poor reproductive or obstetric history, unspecified trimester: Secondary | ICD-10-CM

## 2021-07-25 DIAGNOSIS — Z8751 Personal history of pre-term labor: Secondary | ICD-10-CM | POA: Insufficient documentation

## 2021-07-25 DIAGNOSIS — O09213 Supervision of pregnancy with history of pre-term labor, third trimester: Secondary | ICD-10-CM

## 2021-07-25 DIAGNOSIS — O3660X0 Maternal care for excessive fetal growth, unspecified trimester, not applicable or unspecified: Secondary | ICD-10-CM

## 2021-07-25 DIAGNOSIS — Z8759 Personal history of other complications of pregnancy, childbirth and the puerperium: Secondary | ICD-10-CM | POA: Insufficient documentation

## 2021-07-25 DIAGNOSIS — O24410 Gestational diabetes mellitus in pregnancy, diet controlled: Secondary | ICD-10-CM

## 2021-07-25 DIAGNOSIS — Z3480 Encounter for supervision of other normal pregnancy, unspecified trimester: Secondary | ICD-10-CM | POA: Insufficient documentation

## 2021-07-26 ENCOUNTER — Other Ambulatory Visit (HOSPITAL_COMMUNITY)
Admission: RE | Admit: 2021-07-26 | Discharge: 2021-07-26 | Disposition: A | Discharge status: Home / Self Care | Payer: Medicaid Other | Source: Ambulatory Visit | Attending: Family Medicine | Admitting: Family Medicine

## 2021-07-26 ENCOUNTER — Ambulatory Visit (INDEPENDENT_AMBULATORY_CARE_PROVIDER_SITE_OTHER): Payer: Medicaid Other | Admitting: Family Medicine

## 2021-07-26 VITALS — BP 112/72 | HR 90 | Wt 174.0 lb

## 2021-07-26 DIAGNOSIS — Z3480 Encounter for supervision of other normal pregnancy, unspecified trimester: Secondary | ICD-10-CM | POA: Insufficient documentation

## 2021-07-26 DIAGNOSIS — O3663X0 Maternal care for excessive fetal growth, third trimester, not applicable or unspecified: Secondary | ICD-10-CM

## 2021-07-26 DIAGNOSIS — Z8759 Personal history of other complications of pregnancy, childbirth and the puerperium: Secondary | ICD-10-CM

## 2021-07-26 DIAGNOSIS — O24419 Gestational diabetes mellitus in pregnancy, unspecified control: Secondary | ICD-10-CM

## 2021-07-26 DIAGNOSIS — Z8751 Personal history of pre-term labor: Secondary | ICD-10-CM

## 2021-07-26 NOTE — Progress Notes (Signed)
   PRENATAL VISIT NOTE  Subjective:  Abigail Davidson is a 35 y.o. G3P1102 at [redacted]w[redacted]d being seen today for ongoing prenatal care.  She is currently monitored for the following issues for this high-risk pregnancy and has Supervision of other normal pregnancy, antepartum; History of preterm delivery; History of pre-eclampsia; Alpha thalassemia silent carrier; and Gestational diabetes mellitus (GDM) in third trimester on their problem list.  Patient reports no complaints.  Contractions: Irritability. Vag. Bleeding: None.  Movement: Present. Denies leaking of fluid.   The following portions of the patient's history were reviewed and updated as appropriate: allergies, current medications, past family history, past medical history, past social history, past surgical history and problem list.   Objective:   Vitals:   07/26/21 1010  BP: 112/72  Pulse: 90  Weight: 174 lb (78.9 kg)    Fetal Status: Fetal Heart Rate (bpm): 140   Movement: Present     General:  Alert, oriented and cooperative. Patient is in no acute distress.  Skin: Skin is warm and dry. No rash noted.   Cardiovascular: Normal heart rate noted  Respiratory: Normal respiratory effort, no problems with respiration noted  Abdomen: Soft, gravid, appropriate for gestational age.  Pain/Pressure: Present     Pelvic: Cervical exam performed in the presence of a chaperone        Extremities: Normal range of motion.  Edema: Trace  Mental Status: Normal mood and affect. Normal behavior. Normal judgment and thought content.   Assessment and Plan:  Pregnancy: G3P1102 at [redacted]w[redacted]d 1. Supervision of other normal pregnancy, antepartum FHT and FH normal  2. History of preterm delivery Stop prometrium  3. History of pre-eclampsia ASA 81mg   4. Gestational diabetes mellitus (GDM) in third trimester, gestational diabetes method of control unspecified Reports CBGs as controlled. Fetal growth: 99% (3801g).   5. Macrosomia of fetus affecting  management of mother in third trimester, single or unspecified fetus Fetal growth: 99% (3801g). Extrapolated, baby will be between 4400g and 4600g at 39 weeks (assuming 200-300g per week weight gain). Discussed potential difficulties with labor: shoulder dystocia andsevere laceration. Primary cesarean delivery discussed. She would like to be induced as soon as possible (39 weeks based on A1 GDM).   Preterm labor symptoms and general obstetric precautions including but not limited to vaginal bleeding, contractions, leaking of fluid and fetal movement were reviewed in detail with the patient. Please refer to After Visit Summary for other counseling recommendations.   No follow-ups on file.  Future Appointments  Date Time Provider Department Center  08/02/2021 10:55 AM 08/04/2021, DO [facility] None  08/09/2021 10:15 AM 10/09/2021, DO [facility] None    Levie Heritage, DO

## 2021-07-26 NOTE — Progress Notes (Signed)
Patient presents for 36 week cultures. Armandina Stammer RN

## 2021-07-27 LAB — GC/CHLAMYDIA PROBE AMP (~~LOC~~) NOT AT ARMC
Chlamydia: NEGATIVE
Comment: NEGATIVE
Comment: NORMAL
Neisseria Gonorrhea: NEGATIVE

## 2021-07-30 ENCOUNTER — Encounter: Payer: Self-pay | Admitting: Family Medicine

## 2021-07-30 DIAGNOSIS — B951 Streptococcus, group B, as the cause of diseases classified elsewhere: Secondary | ICD-10-CM | POA: Insufficient documentation

## 2021-07-30 LAB — CULTURE, BETA STREP (GROUP B ONLY): Strep Gp B Culture: POSITIVE — AB

## 2021-08-02 ENCOUNTER — Ambulatory Visit (INDEPENDENT_AMBULATORY_CARE_PROVIDER_SITE_OTHER): Payer: Medicaid Other | Admitting: Family Medicine

## 2021-08-02 ENCOUNTER — Telehealth: Payer: Self-pay | Admitting: *Deleted

## 2021-08-02 ENCOUNTER — Other Ambulatory Visit: Payer: Self-pay

## 2021-08-02 ENCOUNTER — Encounter: Payer: Self-pay | Admitting: *Deleted

## 2021-08-02 VITALS — BP 117/78 | HR 94 | Wt 176.0 lb

## 2021-08-02 DIAGNOSIS — O24410 Gestational diabetes mellitus in pregnancy, diet controlled: Secondary | ICD-10-CM

## 2021-08-02 DIAGNOSIS — Z8751 Personal history of pre-term labor: Secondary | ICD-10-CM

## 2021-08-02 DIAGNOSIS — Z3480 Encounter for supervision of other normal pregnancy, unspecified trimester: Secondary | ICD-10-CM

## 2021-08-02 DIAGNOSIS — Z3A37 37 weeks gestation of pregnancy: Secondary | ICD-10-CM

## 2021-08-02 DIAGNOSIS — Z8759 Personal history of other complications of pregnancy, childbirth and the puerperium: Secondary | ICD-10-CM

## 2021-08-02 NOTE — Progress Notes (Signed)
   PRENATAL VISIT NOTE  Subjective:  Abigail Davidson is a 35 y.o. G3P1102 at [redacted]w[redacted]d being seen today for ongoing prenatal care.  She is currently monitored for the following issues for this high-risk pregnancy and has Supervision of other normal pregnancy, antepartum; History of preterm delivery; History of pre-eclampsia; Alpha thalassemia silent carrier; Gestational diabetes mellitus (GDM) in third trimester; and Positive GBS test on their problem list.  Patient reports no complaints.  Contractions: Irritability. Vag. Bleeding: None.  Movement: Present. Denies leaking of fluid.   The following portions of the patient's history were reviewed and updated as appropriate: allergies, current medications, past family history, past medical history, past social history, past surgical history and problem list.   Objective:   Vitals:   08/02/21 1052  BP: 117/78  Pulse: 94  Weight: 176 lb (79.8 kg)    Fetal Status: Fetal Heart Rate (bpm): 140 Fundal Height: 41 cm Movement: Present  Presentation: Vertex  General:  Alert, oriented and cooperative. Patient is in no acute distress.  Skin: Skin is warm and dry. No rash noted.   Cardiovascular: Normal heart rate noted  Respiratory: Normal respiratory effort, no problems with respiration noted  Abdomen: Soft, gravid, appropriate for gestational age.  Pain/Pressure: Present     Pelvic: Cervical exam deferred        Extremities: Normal range of motion.  Edema: Trace  Mental Status: Normal mood and affect. Normal behavior. Normal judgment and thought content.   Assessment and Plan:  Pregnancy: G3P1102 at [redacted]w[redacted]d 1. [redacted] weeks gestation of pregnancy  2. Supervision of other normal pregnancy, antepartum FHT normal.  3. History of preterm delivery  4. History of pre-eclampsia On ASA 81mg   5. Diet controlled gestational diabetes mellitus (GDM) in third trimester Patient desires primary section. EFW 99% and projects to >4250g.  Diet controlled -  reports CBGs within range.  Preterm labor symptoms and general obstetric precautions including but not limited to vaginal bleeding, contractions, leaking of fluid and fetal movement were reviewed in detail with the patient. Please refer to After Visit Summary for other counseling recommendations.   No follow-ups on file.  Future Appointments  Date Time Provider Department Center  08/09/2021 10:15 AM 10/09/2021, DO [facility] None  08/16/2021  2:00 PM 10/16/2021, DO [facility] None    Levie Heritage, DO

## 2021-08-02 NOTE — Telephone Encounter (Signed)
Call to patient. Advised cesarean section scheduled for Thursday, 08-16-21 at 1400, arrive 1200. Advised will receive letter in mail and pre-op phone call from L&D nurse.  Encounter closed.

## 2021-08-07 NOTE — Patient Instructions (Signed)
Abigail Davidson  08/07/2021   Your procedure is scheduled on:  08/16/2021  Arrive at 1230 at Entrance C on [address] at [facility]  and [employer]. You are invited to use the FREE valet parking or use the Visitor's parking deck.  Pick up the phone at the desk and dial [phone].  Call this number if you have problems the morning of surgery: [phone]  Remember:   Do not eat food:(After Midnight) Desps de medianoche.  Do not drink clear liquids: (After Midnight) Desps de medianoche.  Take these medicines the morning of surgery with A SIP OF WATER:  none   Do not wear jewelry, make-up or nail polish.  Do not wear lotions, powders, or perfumes. Do not wear deodorant.  Do not shave 48 hours prior to surgery.  Do not bring valuables to the hospital.  [name] is not   responsible for any belongings or valuables brought to the hospital.  Contacts, dentures or bridgework may not be worn into surgery.  Leave suitcase in the car. After surgery it may be brought to your room.  For patients admitted to the hospital, checkout time is 11:00 AM the day of              discharge.      Please read over the following fact sheets that you were given:     Preparing for Surgery

## 2021-08-08 ENCOUNTER — Inpatient Hospital Stay (HOSPITAL_COMMUNITY): Payer: Medicaid Other | Admitting: Anesthesiology

## 2021-08-08 ENCOUNTER — Inpatient Hospital Stay (HOSPITAL_COMMUNITY): Admit: 2021-08-08 | Payer: Medicaid Other | Admitting: Obstetrics & Gynecology

## 2021-08-08 ENCOUNTER — Encounter (HOSPITAL_COMMUNITY)
Admission: AD | Disposition: A | Discharge status: Home / Self Care | Payer: Self-pay | Source: Home / Self Care | Attending: Family Medicine

## 2021-08-08 ENCOUNTER — Inpatient Hospital Stay (HOSPITAL_COMMUNITY)
Admission: AD | Admit: 2021-08-08 | Discharge: 2021-08-10 | DRG: 784 | Disposition: A | Discharge status: Home / Self Care | Payer: Medicaid Other | Attending: Family Medicine | Admitting: Family Medicine

## 2021-08-08 ENCOUNTER — Encounter (HOSPITAL_COMMUNITY): Payer: Self-pay | Admitting: Obstetrics and Gynecology

## 2021-08-08 DIAGNOSIS — Z20822 Contact with and (suspected) exposure to covid-19: Secondary | ICD-10-CM | POA: Diagnosis present

## 2021-08-08 DIAGNOSIS — Z7982 Long term (current) use of aspirin: Secondary | ICD-10-CM | POA: Diagnosis not present

## 2021-08-08 DIAGNOSIS — O9081 Anemia of the puerperium: Secondary | ICD-10-CM | POA: Diagnosis not present

## 2021-08-08 DIAGNOSIS — Z8751 Personal history of pre-term labor: Secondary | ICD-10-CM

## 2021-08-08 DIAGNOSIS — Z302 Encounter for sterilization: Secondary | ICD-10-CM

## 2021-08-08 DIAGNOSIS — O3663X1 Maternal care for excessive fetal growth, third trimester, fetus 1: Secondary | ICD-10-CM | POA: Diagnosis not present

## 2021-08-08 DIAGNOSIS — O3663X0 Maternal care for excessive fetal growth, third trimester, not applicable or unspecified: Principal | ICD-10-CM | POA: Diagnosis present

## 2021-08-08 DIAGNOSIS — D563 Thalassemia minor: Secondary | ICD-10-CM | POA: Diagnosis present

## 2021-08-08 DIAGNOSIS — O24419 Gestational diabetes mellitus in pregnancy, unspecified control: Secondary | ICD-10-CM | POA: Diagnosis present

## 2021-08-08 DIAGNOSIS — Z3009 Encounter for other general counseling and advice on contraception: Secondary | ICD-10-CM | POA: Diagnosis not present

## 2021-08-08 DIAGNOSIS — O99824 Streptococcus B carrier state complicating childbirth: Secondary | ICD-10-CM | POA: Diagnosis present

## 2021-08-08 DIAGNOSIS — Z98891 History of uterine scar from previous surgery: Secondary | ICD-10-CM

## 2021-08-08 DIAGNOSIS — O24420 Gestational diabetes mellitus in childbirth, diet controlled: Secondary | ICD-10-CM | POA: Diagnosis present

## 2021-08-08 DIAGNOSIS — D62 Acute posthemorrhagic anemia: Secondary | ICD-10-CM | POA: Diagnosis not present

## 2021-08-08 DIAGNOSIS — O323XX0 Maternal care for face, brow and chin presentation, not applicable or unspecified: Secondary | ICD-10-CM | POA: Diagnosis present

## 2021-08-08 DIAGNOSIS — Z9851 Tubal ligation status: Secondary | ICD-10-CM

## 2021-08-08 DIAGNOSIS — Z8759 Personal history of other complications of pregnancy, childbirth and the puerperium: Secondary | ICD-10-CM

## 2021-08-08 DIAGNOSIS — Z3480 Encounter for supervision of other normal pregnancy, unspecified trimester: Secondary | ICD-10-CM

## 2021-08-08 DIAGNOSIS — Z3A37 37 weeks gestation of pregnancy: Secondary | ICD-10-CM

## 2021-08-08 DIAGNOSIS — O26893 Other specified pregnancy related conditions, third trimester: Secondary | ICD-10-CM | POA: Diagnosis present

## 2021-08-08 HISTORY — DX: Gestational diabetes mellitus in pregnancy, unspecified control: O24.419

## 2021-08-08 HISTORY — DX: Anemia, unspecified: D64.9

## 2021-08-08 LAB — URINALYSIS, ROUTINE W REFLEX MICROSCOPIC
Bilirubin Urine: NEGATIVE
Glucose, UA: NEGATIVE mg/dL
Hgb urine dipstick: NEGATIVE
Ketones, ur: NEGATIVE mg/dL
Leukocytes,Ua: NEGATIVE
Nitrite: NEGATIVE
Protein, ur: NEGATIVE mg/dL
Specific Gravity, Urine: 1.008 (ref 1.005–1.030)
pH: 6 (ref 5.0–8.0)

## 2021-08-08 LAB — CBC
HCT: 34 % — ABNORMAL LOW (ref 36.0–46.0)
Hemoglobin: 10.6 g/dL — ABNORMAL LOW (ref 12.0–15.0)
MCH: 24 pg — ABNORMAL LOW (ref 26.0–34.0)
MCHC: 31.2 g/dL (ref 30.0–36.0)
MCV: 77.1 fL — ABNORMAL LOW (ref 80.0–100.0)
Platelets: 280 10*3/uL (ref 150–400)
RBC: 4.41 MIL/uL (ref 3.87–5.11)
RDW: 16 % — ABNORMAL HIGH (ref 11.5–15.5)
WBC: 9.8 10*3/uL (ref 4.0–10.5)
nRBC: 0 % (ref 0.0–0.2)

## 2021-08-08 LAB — TYPE AND SCREEN
ABO/RH(D): O POS
Antibody Screen: NEGATIVE

## 2021-08-08 LAB — GLUCOSE, CAPILLARY: Glucose-Capillary: 94 mg/dL (ref 70–99)

## 2021-08-08 LAB — RPR: RPR Ser Ql: NONREACTIVE

## 2021-08-08 LAB — RESP PANEL BY RT-PCR (FLU A&B, COVID) ARPGX2
Influenza A by PCR: NEGATIVE
Influenza B by PCR: NEGATIVE
SARS Coronavirus 2 by RT PCR: NEGATIVE

## 2021-08-08 LAB — POCT FERN TEST: POCT Fern Test: POSITIVE

## 2021-08-08 SURGERY — Surgical Case
Anesthesia: Spinal

## 2021-08-08 SURGERY — Surgical Case
Anesthesia: *Unknown

## 2021-08-08 MED ORDER — DIPHENHYDRAMINE HCL 25 MG PO CAPS: 25.0000 mg | ORAL_CAPSULE | Freq: Four times a day (QID) | ORAL | Status: DC | PRN

## 2021-08-08 MED ORDER — ACETAMINOPHEN 500 MG PO TABS: 1000.0000 mg | ORAL_TABLET | Freq: Once | ORAL | Status: DC

## 2021-08-08 MED ORDER — SOD CITRATE-CITRIC ACID 500-334 MG/5ML PO SOLN
30.0000 mL | Freq: Once | ORAL | Status: AC
  Administered 2021-08-08: 30 mL via ORAL
  Filled 2021-08-08: qty 30

## 2021-08-08 MED ORDER — OXYTOCIN-SODIUM CHLORIDE 30-0.9 UT/500ML-% IV SOLN
INTRAVENOUS | Status: AC
  Filled 2021-08-08: qty 500

## 2021-08-08 MED ORDER — FAMOTIDINE IN NACL 20-0.9 MG/50ML-% IV SOLN
20.0000 mg | Freq: Once | INTRAVENOUS | Status: AC
  Administered 2021-08-08: 20 mg via INTRAVENOUS
  Filled 2021-08-08: qty 50

## 2021-08-08 MED ORDER — PHENYLEPHRINE HCL-NACL 20-0.9 MG/250ML-% IV SOLN
INTRAVENOUS | Status: AC
  Filled 2021-08-08: qty 250

## 2021-08-08 MED ORDER — WITCH HAZEL-GLYCERIN EX PADS: 1.0000 "application " | MEDICATED_PAD | CUTANEOUS | Status: DC | PRN

## 2021-08-08 MED ORDER — ENOXAPARIN SODIUM 40 MG/0.4ML IJ SOSY
40.0000 mg | PREFILLED_SYRINGE | INTRAMUSCULAR | Status: DC
  Administered 2021-08-10: 40 mg via SUBCUTANEOUS
  Filled 2021-08-08: qty 0.4

## 2021-08-08 MED ORDER — NALOXONE HCL 0.4 MG/ML IJ SOLN: 0.4000 mg | INTRAMUSCULAR | Status: DC | PRN

## 2021-08-08 MED ORDER — FENTANYL CITRATE (PF) 100 MCG/2ML IJ SOLN
INTRAMUSCULAR | Status: DC | PRN
  Administered 2021-08-08: 15 ug via INTRATHECAL

## 2021-08-08 MED ORDER — KETOROLAC TROMETHAMINE 30 MG/ML IJ SOLN: 30.0000 mg | Freq: Four times a day (QID) | INTRAMUSCULAR | Status: DC | PRN

## 2021-08-08 MED ORDER — DIPHENHYDRAMINE HCL 50 MG/ML IJ SOLN: 12.5000 mg | Freq: Four times a day (QID) | INTRAMUSCULAR | Status: DC | PRN

## 2021-08-08 MED ORDER — CHLOROPROCAINE HCL (PF) 3 % IJ SOLN
20.0000 mL | Freq: Once | INTRAMUSCULAR | Status: DC
  Filled 2021-08-08 (×2): qty 20

## 2021-08-08 MED ORDER — ONDANSETRON HCL 4 MG/2ML IJ SOLN
INTRAMUSCULAR | Status: DC | PRN
  Administered 2021-08-08: 4 mg via INTRAVENOUS

## 2021-08-08 MED ORDER — ACETAMINOPHEN 10 MG/ML IV SOLN
INTRAVENOUS | Status: DC | PRN
  Administered 2021-08-08: 1000 mg via INTRAVENOUS

## 2021-08-08 MED ORDER — MORPHINE SULFATE (PF) 0.5 MG/ML IJ SOLN
INTRAMUSCULAR | Status: DC | PRN
  Administered 2021-08-08: .15 mg via INTRATHECAL

## 2021-08-08 MED ORDER — TRANEXAMIC ACID-NACL 1000-0.7 MG/100ML-% IV SOLN
INTRAVENOUS | Status: AC
  Filled 2021-08-08: qty 100

## 2021-08-08 MED ORDER — NALBUPHINE HCL 10 MG/ML IJ SOLN: 5.0000 mg | INTRAMUSCULAR | Status: DC | PRN

## 2021-08-08 MED ORDER — OXYTOCIN-SODIUM CHLORIDE 30-0.9 UT/500ML-% IV SOLN
INTRAVENOUS | Status: DC | PRN
  Administered 2021-08-08: 300 mL via INTRAVENOUS
  Administered 2021-08-08: 150 mL via INTRAVENOUS

## 2021-08-08 MED ORDER — MEDROXYPROGESTERONE ACETATE 150 MG/ML IM SUSP: 150.0000 mg | INTRAMUSCULAR | Status: DC | PRN

## 2021-08-08 MED ORDER — FENTANYL CITRATE (PF) 100 MCG/2ML IJ SOLN
INTRAMUSCULAR | Status: DC | PRN
  Administered 2021-08-08: 50 ug via INTRAVENOUS
  Administered 2021-08-08: 35 ug via INTRAVENOUS

## 2021-08-08 MED ORDER — OXYTOCIN-SODIUM CHLORIDE 30-0.9 UT/500ML-% IV SOLN: 2.5000 [IU]/h | INTRAVENOUS | Status: AC

## 2021-08-08 MED ORDER — SENNOSIDES-DOCUSATE SODIUM 8.6-50 MG PO TABS
2.0000 | ORAL_TABLET | Freq: Every day | ORAL | Status: DC
  Administered 2021-08-09 – 2021-08-10 (×2): 2 via ORAL
  Filled 2021-08-08 (×2): qty 2

## 2021-08-08 MED ORDER — LACTATED RINGERS IV SOLN: INTRAVENOUS | Status: DC

## 2021-08-08 MED ORDER — MEASLES, MUMPS & RUBELLA VAC IJ SOLR: 0.5000 mL | Freq: Once | INTRAMUSCULAR | Status: DC

## 2021-08-08 MED ORDER — COCONUT OIL OIL: 1.0000 "application " | TOPICAL_OIL | Status: DC | PRN

## 2021-08-08 MED ORDER — ACETAMINOPHEN 160 MG/5ML PO SOLN: 1000.0000 mg | Freq: Once | ORAL | Status: DC

## 2021-08-08 MED ORDER — BUPIVACAINE IN DEXTROSE 0.75-8.25 % IT SOLN
INTRATHECAL | Status: DC | PRN
Start: 2021-08-08 — End: 2021-08-08
  Administered 2021-08-08: 1.5 mg via INTRATHECAL

## 2021-08-08 MED ORDER — TRANEXAMIC ACID-NACL 1000-0.7 MG/100ML-% IV SOLN
INTRAVENOUS | Status: DC | PRN
  Administered 2021-08-08: 1000 mg via INTRAVENOUS

## 2021-08-08 MED ORDER — KETOROLAC TROMETHAMINE 30 MG/ML IJ SOLN
INTRAMUSCULAR | Status: AC
  Filled 2021-08-08: qty 1

## 2021-08-08 MED ORDER — PHENYLEPHRINE HCL-NACL 20-0.9 MG/250ML-% IV SOLN
INTRAVENOUS | Status: DC | PRN
  Administered 2021-08-08: 60 ug/min via INTRAVENOUS

## 2021-08-08 MED ORDER — MENTHOL 3 MG MT LOZG: 1.0000 | LOZENGE | OROMUCOSAL | Status: DC | PRN

## 2021-08-08 MED ORDER — SODIUM CHLORIDE 0.9 % IV SOLN
500.0000 mg | INTRAVENOUS | Status: AC
  Administered 2021-08-08: 250 mg via INTRAVENOUS
  Filled 2021-08-08: qty 500

## 2021-08-08 MED ORDER — SODIUM CHLORIDE 0.9% FLUSH: 3.0000 mL | INTRAVENOUS | Status: DC | PRN

## 2021-08-08 MED ORDER — IBUPROFEN 600 MG PO TABS
600.0000 mg | ORAL_TABLET | Freq: Four times a day (QID) | ORAL | Status: DC
  Administered 2021-08-10 (×4): 600 mg via ORAL
  Filled 2021-08-08 (×4): qty 1

## 2021-08-08 MED ORDER — FENTANYL CITRATE (PF) 100 MCG/2ML IJ SOLN: 25.0000 ug | INTRAMUSCULAR | Status: DC | PRN

## 2021-08-08 MED ORDER — TETANUS-DIPHTH-ACELL PERTUSSIS 5-2.5-18.5 LF-MCG/0.5 IM SUSY: 0.5000 mL | PREFILLED_SYRINGE | Freq: Once | INTRAMUSCULAR | Status: DC

## 2021-08-08 MED ORDER — METOCLOPRAMIDE HCL 5 MG/ML IJ SOLN
10.0000 mg | Freq: Once | INTRAMUSCULAR | Status: AC
  Administered 2021-08-08: 10 mg via INTRAVENOUS
  Filled 2021-08-08: qty 2

## 2021-08-08 MED ORDER — NALBUPHINE HCL 10 MG/ML IJ SOLN
5.0000 mg | Freq: Once | INTRAMUSCULAR | Status: AC | PRN
Start: 2021-08-08 — End: 2021-08-08
  Administered 2021-08-08: 5 mg via INTRAVENOUS

## 2021-08-08 MED ORDER — DEXAMETHASONE SODIUM PHOSPHATE 4 MG/ML IJ SOLN
INTRAMUSCULAR | Status: AC
  Filled 2021-08-08: qty 1

## 2021-08-08 MED ORDER — ACETAMINOPHEN 10 MG/ML IV SOLN
INTRAVENOUS | Status: AC
  Filled 2021-08-08: qty 100

## 2021-08-08 MED ORDER — DEXMEDETOMIDINE (PRECEDEX) IN NS 20 MCG/5ML (4 MCG/ML) IV SYRINGE
PREFILLED_SYRINGE | INTRAVENOUS | Status: DC | PRN
  Administered 2021-08-08: 8 ug via INTRAVENOUS

## 2021-08-08 MED ORDER — PHENYLEPHRINE 40 MCG/ML (10ML) SYRINGE FOR IV PUSH (FOR BLOOD PRESSURE SUPPORT)
PREFILLED_SYRINGE | INTRAVENOUS | Status: DC | PRN
  Administered 2021-08-08: 80 ug via INTRAVENOUS

## 2021-08-08 MED ORDER — PHENYLEPHRINE 40 MCG/ML (10ML) SYRINGE FOR IV PUSH (FOR BLOOD PRESSURE SUPPORT)
PREFILLED_SYRINGE | INTRAVENOUS | Status: AC
  Filled 2021-08-08: qty 10

## 2021-08-08 MED ORDER — ONDANSETRON HCL 4 MG/2ML IJ SOLN
INTRAMUSCULAR | Status: AC
  Filled 2021-08-08: qty 2

## 2021-08-08 MED ORDER — LACTATED RINGERS IV BOLUS
1000.0000 mL | Freq: Once | INTRAVENOUS | Status: AC
  Administered 2021-08-08: 1000 mL via INTRAVENOUS

## 2021-08-08 MED ORDER — SIMETHICONE 80 MG PO CHEW
80.0000 mg | CHEWABLE_TABLET | ORAL | Status: DC | PRN
  Administered 2021-08-09: 80 mg via ORAL

## 2021-08-08 MED ORDER — SODIUM CHLORIDE 0.9 % IV SOLN
INTRAVENOUS | Status: AC
  Filled 2021-08-08: qty 500

## 2021-08-08 MED ORDER — SIMETHICONE 80 MG PO CHEW
80.0000 mg | CHEWABLE_TABLET | Freq: Three times a day (TID) | ORAL | Status: DC
  Administered 2021-08-09 – 2021-08-10 (×4): 80 mg via ORAL
  Filled 2021-08-08 (×5): qty 1

## 2021-08-08 MED ORDER — KETOROLAC TROMETHAMINE 30 MG/ML IJ SOLN
30.0000 mg | Freq: Four times a day (QID) | INTRAMUSCULAR | Status: AC
  Administered 2021-08-08 – 2021-08-09 (×4): 30 mg via INTRAVENOUS
  Filled 2021-08-08 (×4): qty 1

## 2021-08-08 MED ORDER — NALBUPHINE HCL 10 MG/ML IJ SOLN: 5.0000 mg | Freq: Once | INTRAMUSCULAR | Status: AC | PRN

## 2021-08-08 MED ORDER — ONDANSETRON HCL 4 MG/2ML IJ SOLN
4.0000 mg | Freq: Three times a day (TID) | INTRAMUSCULAR | Status: DC | PRN
  Administered 2021-08-08: 4 mg via INTRAVENOUS
  Filled 2021-08-08: qty 2

## 2021-08-08 MED ORDER — KETOROLAC TROMETHAMINE 30 MG/ML IJ SOLN
30.0000 mg | Freq: Once | INTRAMUSCULAR | Status: AC
  Administered 2021-08-08: 30 mg via INTRAVENOUS

## 2021-08-08 MED ORDER — DEXMEDETOMIDINE (PRECEDEX) IN NS 20 MCG/5ML (4 MCG/ML) IV SYRINGE
PREFILLED_SYRINGE | INTRAVENOUS | Status: AC
  Filled 2021-08-08: qty 5

## 2021-08-08 MED ORDER — OXYCODONE HCL 5 MG PO TABS: 5.0000 mg | ORAL_TABLET | ORAL | Status: DC | PRN

## 2021-08-08 MED ORDER — CEFAZOLIN SODIUM-DEXTROSE 2-4 GM/100ML-% IV SOLN
2.0000 g | INTRAVENOUS | Status: AC
  Administered 2021-08-08: 2 g via INTRAVENOUS
  Filled 2021-08-08: qty 100

## 2021-08-08 MED ORDER — PRENATAL MULTIVITAMIN CH
1.0000 | ORAL_TABLET | Freq: Every day | ORAL | Status: DC
  Administered 2021-08-09 – 2021-08-10 (×2): 1 via ORAL
  Filled 2021-08-08 (×2): qty 1

## 2021-08-08 MED ORDER — PROMETHAZINE HCL 25 MG/ML IJ SOLN: 6.2500 mg | INTRAMUSCULAR | Status: DC | PRN

## 2021-08-08 MED ORDER — ACETAMINOPHEN 500 MG PO TABS
1000.0000 mg | ORAL_TABLET | Freq: Four times a day (QID) | ORAL | Status: AC
  Administered 2021-08-08 – 2021-08-09 (×3): 1000 mg via ORAL
  Filled 2021-08-08 (×3): qty 2

## 2021-08-08 MED ORDER — CHLOROPROCAINE HCL (PF) 3 % IJ SOLN
INTRAMUSCULAR | Status: DC | PRN
  Administered 2021-08-08: 10 mL

## 2021-08-08 MED ORDER — NALBUPHINE HCL 10 MG/ML IJ SOLN
5.0000 mg | INTRAMUSCULAR | Status: DC | PRN
Start: 2021-08-08 — End: 2021-08-11

## 2021-08-08 MED ORDER — NALBUPHINE HCL 10 MG/ML IJ SOLN
INTRAMUSCULAR | Status: AC
  Filled 2021-08-08: qty 1

## 2021-08-08 MED ORDER — NALOXONE HCL 4 MG/10ML IJ SOLN
1.0000 ug/kg/h | INTRAVENOUS | Status: DC | PRN
  Filled 2021-08-08: qty 5

## 2021-08-08 MED ORDER — DEXAMETHASONE SODIUM PHOSPHATE 4 MG/ML IJ SOLN
INTRAMUSCULAR | Status: DC | PRN
  Administered 2021-08-08: 4 mg via INTRAVENOUS

## 2021-08-08 MED ORDER — MORPHINE SULFATE (PF) 0.5 MG/ML IJ SOLN
INTRAMUSCULAR | Status: AC
  Filled 2021-08-08: qty 10

## 2021-08-08 MED ORDER — DIBUCAINE (PERIANAL) 1 % EX OINT: 1.0000 "application " | TOPICAL_OINTMENT | CUTANEOUS | Status: DC | PRN

## 2021-08-08 MED ORDER — FENTANYL CITRATE (PF) 100 MCG/2ML IJ SOLN
INTRAMUSCULAR | Status: AC
  Filled 2021-08-08: qty 2

## 2021-08-08 SURGICAL SUPPLY — 37 items
BENZOIN TINCTURE PRP APPL 2/3 (GAUZE/BANDAGES/DRESSINGS) ×2 IMPLANT
CHLORAPREP W/TINT 26ML (MISCELLANEOUS) ×2 IMPLANT
CLAMP CORD UMBIL (MISCELLANEOUS) IMPLANT
CLIP FILSHIE TUBAL LIGA STRL (Clip) IMPLANT
CLOTH BEACON ORANGE TIMEOUT ST (SAFETY) ×2 IMPLANT
DRSG OPSITE POSTOP 4X10 (GAUZE/BANDAGES/DRESSINGS) ×2 IMPLANT
ELECT REM PT RETURN 9FT ADLT (ELECTROSURGICAL) ×2
ELECTRODE REM PT RTRN 9FT ADLT (ELECTROSURGICAL) ×1 IMPLANT
EXTRACTOR VACUUM BELL STYLE (SUCTIONS) ×2 IMPLANT
EXTRACTOR VACUUM KIWI (MISCELLANEOUS) ×2 IMPLANT
EXTRACTOR VACUUM M CUP 4 TUBE (SUCTIONS) IMPLANT
GLOVE BIOGEL PI IND STRL 7.0 (GLOVE) ×3 IMPLANT
GLOVE BIOGEL PI INDICATOR 7.0 (GLOVE) ×3
GLOVE ECLIPSE 7.0 STRL STRAW (GLOVE) ×2 IMPLANT
GOWN STRL REUS W/TWL LRG LVL3 (GOWN DISPOSABLE) ×4 IMPLANT
KIT ABG SYR 3ML LUER SLIP (SYRINGE) IMPLANT
LIGASURE IMPACT 36 18CM CVD LR (INSTRUMENTS) ×2 IMPLANT
NEEDLE HYPO 22GX1.5 SAFETY (NEEDLE) ×2 IMPLANT
NEEDLE HYPO 25X5/8 SAFETYGLIDE (NEEDLE) ×2 IMPLANT
NS IRRIG 1000ML POUR BTL (IV SOLUTION) ×2 IMPLANT
PACK C SECTION WH (CUSTOM PROCEDURE TRAY) ×2 IMPLANT
PAD ABD 7.5X8 STRL (GAUZE/BANDAGES/DRESSINGS) ×2 IMPLANT
PAD OB MATERNITY 4.3X12.25 (PERSONAL CARE ITEMS) ×2 IMPLANT
PENCIL SMOKE EVAC W/HOLSTER (ELECTROSURGICAL) ×2 IMPLANT
RTRCTR C-SECT PINK 25CM LRG (MISCELLANEOUS) IMPLANT
STRIP CLOSURE SKIN 1/2X4 (GAUZE/BANDAGES/DRESSINGS) ×2 IMPLANT
SUT PDS AB 0 CTX 36 PDP370T (SUTURE) IMPLANT
SUT PLAIN 2 0 (SUTURE) ×3
SUT PLAIN 2 0 XLH (SUTURE) IMPLANT
SUT PLAIN ABS 2-0 CT1 27XMFL (SUTURE) ×3 IMPLANT
SUT VIC AB 0 CTX 36 (SUTURE) ×2
SUT VIC AB 0 CTX36XBRD ANBCTRL (SUTURE) ×2 IMPLANT
SUT VIC AB 4-0 KS 27 (SUTURE) ×2 IMPLANT
SYR CONTROL 10ML LL (SYRINGE) ×2 IMPLANT
TOWEL OR 17X24 6PK STRL BLUE (TOWEL DISPOSABLE) ×2 IMPLANT
TRAY FOLEY W/BAG SLVR 14FR LF (SET/KITS/TRAYS/PACK) ×2 IMPLANT
WATER STERILE IRR 1000ML POUR (IV SOLUTION) ×2 IMPLANT

## 2021-08-08 NOTE — Anesthesia Preprocedure Evaluation (Signed)
Anesthesia Evaluation    Reviewed: Allergy & Precautions, Patient's Chart, lab work & pertinent test results  History of Anesthesia Complications Negative for: history of anesthetic complications  Airway Mallampati: II  TM Distance: >3 FB Neck ROM: Full    Dental  (+) Missing,    Pulmonary neg pulmonary ROS,    Pulmonary exam normal        Cardiovascular negative cardio ROS Normal cardiovascular exam     Neuro/Psych negative neurological ROS  negative psych ROS   GI/Hepatic negative GI ROS, Neg liver ROS,   Endo/Other  diabetes, Gestational  Renal/GU negative Renal ROS  negative genitourinary   Musculoskeletal negative musculoskeletal ROS (+)   Abdominal   Peds  Hematology Hgb 10.6, plt 280k   Anesthesia Other Findings Day of surgery medications reviewed with patient.  Reproductive/Obstetrics (+) Pregnancy (suspected LGA fetus)                             Anesthesia Physical Anesthesia Plan  ASA: 2  Anesthesia Plan: Spinal   Post-op Pain Management:    Induction:   PONV Risk Score and Plan: 4 or greater and Treatment may vary due to age or medical condition, Ondansetron and Dexamethasone  Airway Management Planned: Natural Airway  Additional Equipment: None  Intra-op Plan:   Post-operative Plan:   Informed Consent: I have reviewed the patients History and Physical, chart, labs and discussed the procedure including the risks, benefits and alternatives for the proposed anesthesia with the patient or authorized representative who has indicated his/her understanding and acceptance.       Plan Discussed with: CRNA  Anesthesia Plan Comments:         Anesthesia Quick Evaluation

## 2021-08-08 NOTE — H&P (Signed)
OBSTETRIC ADMISSION HISTORY AND PHYSICAL  Abigail Davidson is a 35 y.o. female [phone] with IUP at 60w6dby early [place] for SROM. She states her water broke around 5:45 AM with clear fluid. She is not feeling contractions at this time. She reports +FMs, no VB, no blurry vision, headaches, peripheral edema, or RUQ pain.  She plans on bottle feeding. She requests BTS for birth control.  She received her prenatal care at [facility].   Dating: By [place]--->  Estimated Date of Delivery: 08/23/21  Sono:   @[redacted]w[redacted]d , CWD, normal anatomy, cephalic presentation, posterior fundal placental lie, 3801g, > 99% EFW  Prenatal History/Complications:  GDMA1 Hx preterm delivery  Hx of pre-eclampsia Alpha thalassemia silent carrier GBS positive   Past Medical History: Past Medical History:  Diagnosis Date   Anemia    Gestational diabetes    Pregnancy induced hypertension    Preterm labor     Past Surgical History: History reviewed. No pertinent surgical history.  Obstetrical History: OB History     Gravida  3   Para  2   Term  1   Preterm  1   AB      Living  2      SAB      IAB      Ectopic      Multiple      Live Births  2           Social History Social History   Socioeconomic History   Marital status: Single    Spouse name: Not on file   Number of children: Not on file   Years of education: Not on file   Highest education level: Not on file  Occupational History   Not on file  Tobacco Use   Smoking status: Never   Smokeless tobacco: Never  Vaping Use   Vaping Use: Never used  Substance and Sexual Activity   Alcohol use: Never   Drug use: Never   Sexual activity: Yes    Birth control/protection: None  Other Topics Concern   Not on file  Social History Narrative   Not on file   Social Determinants of Health   Financial Resource Strain: Not on file  Food Insecurity: Not on file  Transportation Needs: Not on file  Physical Activity: Not on  file  Stress: Not on file  Social Connections: Not on file    Family History: Family History  Problem Relation Age of Onset   Emphysema Mother    Parkinson's disease Father     Allergies: No Known Allergies  Medications Prior to Admission  Medication Sig Dispense Refill Last Dose   aspirin EC 81 MG tablet Take 1 tablet (81 mg total) by mouth daily. Take after 12 weeks for prevention of preeclampsia later in pregnancy 300 tablet 1 08/07/2021   Prenatal Vit-Fe Fumarate-FA (PRENATAL VITAMINS) 28-0.8 MG TABS Take 1 tablet by mouth daily. 30 tablet 11 08/07/2021   Accu-Chek Softclix Lancets lancets 1 each by Other route 4 (four) times daily. Use as instructed 100 each 12    Blood Glucose Monitoring Suppl (ACCU-CHEK NANO SMARTVIEW) w/Device KIT 1 kit by Subdermal route as directed. Check blood sugars for fasting, and two hours after breakfast, lunch and dinner (4 checks daily) 1 kit 0    glucose blood test strip Use as instructed 100 each 12      Review of Systems  All systems reviewed and negative except as stated in HPI  Blood  pressure 129/82, pulse 94, temperature 98.4 F (36.9 C), temperature source Oral, resp. rate 17, height 4' 11"  (1.499 m), weight 81.1 kg, last menstrual period 11/07/2020, SpO2 99 %.  General appearance: alert, cooperative, and no distress Lungs: normal work of breathing on room air  Heart: normal rate Abdomen: soft, gravid, nontender to palpation Extremities: no LE edema or calf tenderness to palpation   Fetal monitoring: Baseline 135 bpm, moderate variability, + accels, - decels  Uterine activity: Irregular contractions   Prenatal labs: ABO, Rh: --/--/O POS (08/31 0820) Antibody: NEG (08/31 0820) Rubella: 1.03 (03/01 1036) RPR: Non Reactive (06/24 0836)  HBsAg: Negative (03/01 1036)  HIV: Non Reactive (06/24 0836)  GBS: Positive/-- (08/18 1107)  1 hr Glucola - abnormal; GDMA1 Genetic screening - NIPS low risk female; AFP neg Anatomy US normal    Prenatal Transfer Tool  Maternal Diabetes: Yes:  Diabetes Type:  Diet controlled Genetic Screening: Normal Maternal Ultrasounds/Referrals: Normal and Other: LGA >99%ile  Fetal Ultrasounds or other Referrals:  Referred to Materal Fetal Medicine  Maternal Substance Abuse:  No Significant Maternal Medications:  ASA 81 mg, PNV Significant Maternal Lab Results: Group B Strep positive  Results for orders placed or performed during the hospital encounter of 08/08/21 (from the past 24 hour(s))  Fern Test   Collection Time: 08/08/21  8:10 AM  Result Value Ref Range   POCT Fern Test Positive = ruptured amniotic membanes   Glucose, capillary   Collection Time: 08/08/21  8:18 AM  Result Value Ref Range   Glucose-Capillary 94 70 - 99 mg/dL  Type and screen [facility]   Collection Time: 08/08/21  8:20 AM  Result Value Ref Range   ABO/RH(D) O POS    Antibody Screen NEG    Sample Expiration      08/11/2021,2359 Performed at [facility], [address] [place], [place] [address]   Resp Panel by RT-PCR (Flu A&B, Covid) Nasopharyngeal Swab   Collection Time: 08/08/21  8:28 AM   Specimen: Nasopharyngeal Swab; Nasopharyngeal(NP) swabs in vial transport medium  Result Value Ref Range   SARS Coronavirus 2 by RT PCR NEGATIVE NEGATIVE   Influenza A by PCR NEGATIVE NEGATIVE   Influenza B by PCR NEGATIVE NEGATIVE  CBC   Collection Time: 08/08/21  8:28 AM  Result Value Ref Range   WBC 9.8 4.0 - 10.5 K/uL   RBC 4.41 3.87 - 5.11 MIL/uL   Hemoglobin 10.6 (L) 12.0 - 15.0 g/dL   HCT 34.0 (L) 36.0 - 46.0 %   MCV 77.1 (L) 80.0 - 100.0 fL   MCH 24.0 (L) 26.0 - 34.0 pg   MCHC 31.2 30.0 - 36.0 g/dL   RDW 16.0 (H) 11.5 - 15.5 %   Platelets 280 150 - 400 K/uL   nRBC 0.0 0.0 - 0.2 %  Urinalysis, Routine w reflex microscopic Urine, Clean Catch   Collection Time: 08/08/21  8:28 AM  Result Value Ref Range   Color, Urine YELLOW YELLOW   APPearance HAZY (A) CLEAR   Specific  Gravity, Urine 1.008 1.005 - 1.030   pH 6.0 5.0 - 8.0   Glucose, UA NEGATIVE NEGATIVE mg/dL   Hgb urine dipstick NEGATIVE NEGATIVE   Bilirubin Urine NEGATIVE NEGATIVE   Ketones, ur NEGATIVE NEGATIVE mg/dL   Protein, ur NEGATIVE NEGATIVE mg/dL   Nitrite NEGATIVE NEGATIVE   Leukocytes,Ua NEGATIVE NEGATIVE    Patient Active Problem List   Diagnosis Date Noted   Indication for care in  labor or delivery 08/08/2021   Positive GBS test 07/30/2021   Gestational diabetes mellitus (GDM) in third trimester 06/05/2021   Alpha thalassemia silent carrier 02/26/2021   Supervision of other normal pregnancy, antepartum 02/06/2021   History of preterm delivery 02/06/2021   History of pre-eclampsia 02/06/2021    Assessment/Plan:  Abigail Davidson is a 35 y.o. G3P1102 at 95w6dhere for SROM. Patient would like to proceed with Cesarean section and BTS due to LGA infant, GDMA1, and hx of perineal lacerations with 4-6 lbs babies previously.  The risks of surgery were discussed with the patient including but were not limited to: bleeding which may require transfusion or reoperation; infection which may require antibiotics; injury to bowel, bladder, ureters or other surrounding organs; injury to the fetus; need for additional procedures including hysterectomy in the event of a life-threatening hemorrhage; formation of adhesions; placental abnormalities with subsequent pregnancies; incisional problems; thromboembolic phenomenon and other postoperative/anesthesia complications.    Patient also desires permanent sterilization.  Other reversible forms of contraception were discussed with patient; she declines all other modalities. Risks of procedure discussed with patient including but not limited to: risk of regret, permanence of method, bleeding, infection, injury to surrounding organs and need for additional procedures.  Failure risk of about 1% with increased risk of ectopic gestation if pregnancy occurs was  also discussed with patient.  Also discussed possibility of post-tubal pain syndrome. Patient verbalized understanding of these risks and wants to proceed with sterilization.  The patient concurred with the proposed plan, giving informed written consent for the procedure.   Patient has been NPO since 10:30 PM last evening and she will remain NPO for procedure. Anesthesia and OR aware. Preoperative prophylactic antibiotics and SCDs ordered on call to the OR.  To OR when ready.  #Pain: Spinal #FWB: Cat 1 tracing  #ID:  GBS positive; pre-operative Ancef and Azithromycin ordered given SROM #MOF: Bottle  #MOC: BTS #Circ:  Yes  CGenia Del MD  OB Fellow  Faculty Practice 08/08/2021, 10:41 AM

## 2021-08-08 NOTE — Transfer of Care (Signed)
Immediate Anesthesia Transfer of Care Note  Patient: Abigail Davidson  Procedure(s) Performed: CESAREAN SECTION  Patient Location: PACU  Anesthesia Type:Spinal  Level of Consciousness: awake  Airway & Oxygen Therapy: Patient Spontanous Breathing  Post-op Assessment: Report given to RN  Post vital signs: Reviewed and stable  Last Vitals:  Vitals Value Taken Time  BP 109/69 08/08/21 1320  Temp    Pulse 88 08/08/21 1323  Resp 22 08/08/21 1323  SpO2 98 % 08/08/21 1323  Vitals shown include unvalidated device data.  Last Pain:  Vitals:   08/08/21 0920  TempSrc:   PainSc: 0-No pain         Complications: No notable events documented.

## 2021-08-08 NOTE — Anesthesia Procedure Notes (Signed)
Spinal  Patient location during procedure: OR Start time: 08/08/2021 11:11 AM End time: 08/08/2021 11:14 AM Reason for block: surgical anesthesia Staffing Performed: anesthesiologist  Anesthesiologist: Kaylyn Layer, MD Preanesthetic Checklist Completed: patient identified, IV checked, risks and benefits discussed, monitors and equipment checked, pre-op evaluation and timeout performed Spinal Block Patient position: sitting Prep: DuraPrep and site prepped and draped Patient monitoring: heart rate, continuous pulse ox and blood pressure Approach: midline Location: L3-4 Injection technique: single-shot Needle Needle type: Pencan  Needle gauge: 24 G Needle length: 10 cm Assessment Sensory level: T4 Events: CSF return Additional Notes Risks, benefits, and alternative discussed. Patient gave consent to procedure. Prepped and draped in sitting position. Clear CSF obtained after one needle pass. Positive terminal aspiration. No pain or paraesthesias with injection. Patient tolerated procedure well. Vital signs stable. Abigail Greenhouse, MD

## 2021-08-08 NOTE — Discharge Summary (Signed)
Postpartum Discharge Summary  Date of Service updated     Patient Name: Abigail Davidson DOB: 1986/08/11 MRN: [id number]  Date of admission: 08/08/2021 Delivery date:08/08/2021  Delivering provider: Caren Macadam  Date of discharge: 08/10/2021  Admitting diagnosis: Indication for care in labor or delivery [O75.9] Intrauterine pregnancy: [redacted]w[redacted]d    Secondary diagnosis:  Principal Problem:   S/P primary low transverse C-section Active Problems:   Supervision of other normal pregnancy, antepartum   History of preterm delivery   Gestational diabetes mellitus (GDM) in third trimester   Unwanted fertility   H/O tubal ligation   PPH (postpartum hemorrhage)   Acute blood loss anemia  Additional problems: None     Discharge diagnosis: Term Pregnancy Delivered                                              Post partum procedures: IV venofer for acute blood loss anemia Augmentation: N/A Complications: [id number] Hospital course: Sceduled C/S   35y.o. yo G3P2103 at 37w6das admitted to the hospital 08/08/2021 for scheduled cesarean section with the following indication:Elective Primary for LGA infant and GDM. Delivery details are as follows:  Membrane Rupture Time/Date: 5:40 AM ,08/08/2021   Delivery Method:C-Section, Vacuum Assisted  Details of operation can be found in separate operative note.  Patient had an uncomplicated postpartum course.  She is ambulating, tolerating a regular diet, passing flatus, and urinating well. Fasting CBG postpartum of 95. Patient is discharged home in stable condition on  08/10/21        Newborn Data: Birth date:08/08/2021  Birth time:11:50 AM  Gender:Female  Living status:Living  Apgars:3 ,9  Weight:3905 g     Magnesium Sulfate received: No BMZ received: No Rhophylac:N/A MMR:N/A - Immune  T-DaP:Given prenatally Flu: No Transfusion: IV venofer only   Physical exam  Vitals:   08/09/21 0407 08/09/21 1500 08/09/21 2020 08/10/21  0538  BP: 107/63 111/80 108/76 114/78  Pulse:  82 91 78  Resp: 17 17 16 15   Temp: 99 F (37.2 C) 98.4 F (36.9 C) 99 F (37.2 C) 98.5 F (36.9 C)  TempSrc: Oral Oral Oral Oral  SpO2: 97% 100% 99% 99%  Weight:      Height:       General: alert, cooperative, and no distress Lochia: appropriate Uterine Fundus: firm Incision: Healing well with no significant drainage, Dressing is clean, dry, and intact DVT Evaluation: No significant calf/ankle edema. Labs: Lab Results  Component Value Date   WBC 13.4 (H) 08/09/2021   HGB 7.5 (L) 08/09/2021   HCT 23.4 (L) 08/09/2021   MCV 75.7 (L) 08/09/2021   PLT 235 08/09/2021   CMP Latest Ref Rng & Units 06/14/2021  Glucose 65 - 99 mg/dL 111(H)  BUN 6 - 20 mg/dL 6  Creatinine 0.57 - 1.00 mg/dL 0.61  Sodium 134 - 144 mmol/L 136  Potassium 3.5 - 5.2 mmol/L 3.8  Chloride 96 - 106 mmol/L 100  CO2 20 - 29 mmol/L 19(L)  Calcium 8.7 - 10.2 mg/dL 8.6(L)  Total Protein 6.0 - 8.5 g/dL 6.5  Total Bilirubin 0.0 - 1.2 mg/dL <0.2  Alkaline Phos 44 - 121 IU/L 169(H)  AST 0 - 40 IU/L 15  ALT 0 - 32 IU/L 16   Edinburgh Score: Edinburgh Postnatal Depression Scale Screening Tool 08/09/2021  I have been able  to laugh and see the funny side of things. 0  I have looked forward with enjoyment to things. 0  I have blamed myself unnecessarily when things went wrong. 0  I have been anxious or worried for no good reason. 0  I have felt scared or panicky for no good reason. 0  Things have been getting on top of me. 0  I have been so unhappy that I have had difficulty sleeping. 0  I have felt sad or miserable. 0  I have been so unhappy that I have been crying. 0  The thought of harming myself has occurred to me. 0  Edinburgh Postnatal Depression Scale Total 0     After visit meds:  Allergies as of 08/10/2021   No Known Allergies      Medication List     STOP taking these medications    Accu-Chek Nano SmartView w/Device Kit   Accu-Chek Softclix  Lancets lancets   aspirin EC 81 MG tablet   glucose blood test strip       TAKE these medications    acetaminophen 325 MG tablet Commonly known as: TYLENOL Take 2 tablets (650 mg total) by mouth every 6 (six) hours as needed for moderate pain.   ibuprofen 600 MG tablet Commonly known as: ADVIL Take 1 tablet (600 mg total) by mouth every 6 (six) hours as needed.   oxyCODONE 5 MG immediate release tablet Commonly known as: Oxy IR/ROXICODONE Take 1-2 tablets (5-10 mg total) by mouth every 6 (six) hours as needed for moderate pain.   Prenatal Vitamins 28-0.8 MG Tabs Take 1 tablet by mouth daily.         Discharge home in stable condition Infant Feeding: Bottle Infant Disposition:home with mother Discharge instruction: per After Visit Summary and Postpartum booklet. Activity: Advance as tolerated. Pelvic rest for 6 weeks.  Diet: routine diet Future Appointments: Future Appointments  Date Time Provider [place]  08/24/2021  9:55 AM Clarnce Flock, MD [facility] None  09/20/2021  8:35 AM Donnamae Jude, MD [facility] None   Follow up Visit: Message sent to [facility] by Dr. Gwenlyn Perking on 08/08/21.  Please schedule this patient for a In person postpartum visit in 4 weeks with the following provider: MD. Additional Postpartum F/U: 2 hour GTT and Incision check 1 week  High risk pregnancy complicated by: GDM Delivery mode:  C-Section, Vacuum Assisted  Anticipated Birth Control:  BTL done PP   08/10/2021 Patriciaann Clan, DO

## 2021-08-08 NOTE — Anesthesia Postprocedure Evaluation (Signed)
Anesthesia Post Note  Patient: Abigail Davidson  Procedure(s) Performed: CESAREAN SECTION     Patient location during evaluation: PACU Anesthesia Type: Spinal Level of consciousness: awake and alert and oriented Pain management: pain level controlled Vital Signs Assessment: post-procedure vital signs reviewed and stable Respiratory status: spontaneous breathing, nonlabored ventilation and respiratory function stable Cardiovascular status: blood pressure returned to baseline Postop Assessment: no apparent nausea or vomiting, spinal receding, no headache and no backache Anesthetic complications: no   No notable events documented.  Last Vitals:  Vitals:   08/08/21 1333 08/08/21 1345  BP: 104/68 (!) 102/53  Pulse: 86 86  Resp: 16 (!) 21  Temp:    SpO2: 100% 96%    Last Pain:  Vitals:   08/08/21 1320  TempSrc: Oral  PainSc: 3    Pain Goal:                Epidural/Spinal Function Cutaneous sensation: Able to Wiggle Toes (08/08/21 1345), Patient able to flex knees: Yes (08/08/21 1345), Patient able to lift hips off bed: Yes (08/08/21 1345), Back pain beyond tenderness at insertion site: No (08/08/21 1345), Progressively worsening motor and/or sensory loss: No (08/08/21 1345), Bowel and/or bladder incontinence post epidural: No (08/08/21 1345)  Shanda Howells

## 2021-08-08 NOTE — Op Note (Signed)
Operative Note   Patient: Abigail Davidson  Date of Procedure: 08/08/2021  Procedure: Primary Low Transverse Cesarean and bilateral salpingectomy    Indications: GDM with EFM > 99th% (>4250g), MFM recommendation for primary CS, undesired fertility  Pre-operative Diagnosis: Primary Cesarean Section for Macrosomia.   Post-operative Diagnosis: Same and Bilateral Tubal Sterilization via Bilateral salpingectomy  TOLAC Candidate: Yes   Surgeon: Surgeon(s) and Role:    * Federico Flake, MD - Primary    * Adam Phenix, MD - Assisting    * Steva Ready, DO - Assisting    * Worthy Rancher, MD - Fellow  Assistants: Dr. Connye Burkitt and Dr. Debroah Loop  An experienced assistant was required given the standard of surgical care given the complexity of the case.  This assistant was needed for exposure, dissection, suctioning, retraction, instrument exchange, assisting with delivery with administration of fundal pressure, and for overall help during the procedure.   Anesthesia: spinal  Anesthesiologist: No responsible provider has been recorded for the case.   Antibiotics: Cefazolin and Azithromycin   Estimated Blood Loss: 1100 ml   Total IV Fluids: 2000 ml  Urine Output:  125 cc OF clear urine  Specimens: Right and Left Fallopian Tubes    Complications:  Right salpingectomy with suture slipping off the pedicle, controlled with Kelly clamp. Dr Connye Burkitt came into the case and utilized ligasure to complete salpingectomy    Indications: Abigail Davidson is a 35 y.o. G3O7564 with an IUP [redacted]w[redacted]d presenting for unscheduled, urgent cesarean secondary to the indications listed above. Clinical course notable for SROM at ~5 AM. Patient was previously scheduled for pLTCS at 39 weeks.   Findings: Viable infant in cephalic presentation, direct OP, no nuchal cord present. Apgars 3 , 9 , . [phone]   . Clear amniotic fluid. Normal placenta, three vessel cord. Normal uterus, Normal  bilateral fallopian tubes, Normal bilateral ovaries.  Procedure Details:   Procedure Details: A Time Out was held and the above information confirmed. The patient received intravenous antibiotics and had sequential compression devices applied to her lower extremities preoperatively. The patient was taken back to the operative suite where spinal anesthesia was administered. After induction of anesthesia, the patient was draped and prepped in the usual sterile manner and placed in a dorsal supine position with a leftward tilt. A low transverse skin incision was made with scalpel and carried down through the subcutaneous tissue to the fascia. Fascial incision was made and extended transversely. The fascia was separated from the underlying rectus tissue superiorly and inferiorly. The rectus muscles were separated in the midline bluntly and the peritoneum was entered bluntly. An Alexis retractor was placed to aid in visualization of the uterus. A bladder flap was not developed. A low transverse uterine incision was made. The infant was direct OP with extension of the fetal head and face presentation to the hysterotomy. Additionally there was cord presenting across the fetal head that was difficult to reduce. that required deep flexion and turning of fetal head/body to deliver. The umbilical cord was clamped immediately. From hysterotomy to fetal head delivery was 7 minutes of elapsed time. Cord ph was sent- pH 7.33 and cord blood was obtained for evaluation. The placenta was removed Intact and appeared normal. The uterine incision was closed with running locked sutures of 0-Vicryl, and then a second imbricating layer was also placed with 0-Vicryl. Overall, excellent hemostasis was noted.   Attention was then turned to the fallopian tubes.   Bilateral salpingectomy: A  Kelly clamp was placed across the right fallopian tube taking care to incorporate the fimbriae. A second clamp was then placed below the first. The  pedicle was then ligated with 2-0 plain gut free tie suture. The fallopian tube was then removed with Metzenbaum scissors. Clamps were removed and had excellent hemostasis noted initially and then pedicle pulled through the suture. Kelly clamps were placed on both pedicles to control bleeding.  Due to the complication I called for assistance. Dr. Connye Burkitt was available and came to the operating room. We decided to utilize the Ligasure for the right pedicles due to lack of tissue to anchor and large venous structures in the mesosalpinx.  We utilized the Ligasure for the left fallopian tube as well. Bilateral tubal segements were sent to pathology. Excellent hemostasis noted.  The abdomen and the pelvis were cleared of all clot and debris and the Jon Gills was removed. Hemostasis was confirmed on all surfaces.  The peritoneum was reapproximated using 2-0 vicryl . The fascia was then closed using 0 Vicryl in a running fashion. The subcutaneous layer was reapproximated with plain gut and the skin was closed with a 4-0 vicryl subcuticular stitch. The patient tolerated the procedure well. Sponge, lap, instrument and needle counts were correct x 2. She was taken to the recovery room in stable condition.  Disposition: PACU - hemodynamically stable.    Signed: Federico Flake, MD, MPH [facility])

## 2021-08-08 NOTE — MAU Note (Addendum)
Water broke at 0540, clear fluid, still coming. No bleeding, no contractions.  Is feeling some pelvic pressure.  2 prior vag deliveries.  Pt is scheduled for c/s "baby is too big".   GDM

## 2021-08-09 ENCOUNTER — Other Ambulatory Visit: Payer: Self-pay

## 2021-08-09 ENCOUNTER — Encounter: Payer: Medicaid Other | Admitting: Family Medicine

## 2021-08-09 DIAGNOSIS — D62 Acute posthemorrhagic anemia: Secondary | ICD-10-CM | POA: Diagnosis not present

## 2021-08-09 LAB — CBC
HCT: 23.4 % — ABNORMAL LOW (ref 36.0–46.0)
Hemoglobin: 7.5 g/dL — ABNORMAL LOW (ref 12.0–15.0)
MCH: 24.3 pg — ABNORMAL LOW (ref 26.0–34.0)
MCHC: 32.1 g/dL (ref 30.0–36.0)
MCV: 75.7 fL — ABNORMAL LOW (ref 80.0–100.0)
Platelets: 235 10*3/uL (ref 150–400)
RBC: 3.09 MIL/uL — ABNORMAL LOW (ref 3.87–5.11)
RDW: 16.2 % — ABNORMAL HIGH (ref 11.5–15.5)
WBC: 13.4 10*3/uL — ABNORMAL HIGH (ref 4.0–10.5)
nRBC: 0 % (ref 0.0–0.2)

## 2021-08-09 MED ORDER — SODIUM CHLORIDE 0.9 % IV SOLN
500.0000 mg | Freq: Once | INTRAVENOUS | Status: AC
  Administered 2021-08-09: 500 mg via INTRAVENOUS
  Filled 2021-08-09: qty 25

## 2021-08-09 NOTE — Progress Notes (Addendum)
Subjective: Postpartum Day #1: Cesarean Delivery & BTS Patient reports tolerating PO and no problems voiding; bottlefeeding going well; she desires a circumcision for her son- consented and note placed in infant's chart; denies dizziness w ambulation  Objective: Vital signs in last 24 hours: Temp:  [98 F (36.7 C)-99 F (37.2 C)] 99 F (37.2 C) (09/01 0407) Pulse Rate:  [67-97] 92 (08/31 1900) Resp:  [13-23] 17 (09/01 0407) BP: (97-129)/(29-83) 107/63 (09/01 0407) SpO2:  [96 %-100 %] 97 % (09/01 0407)  Physical Exam:  General: alert, cooperative, and no distress Lochia: appropriate Uterine Fundus: firm Incision: honeycomb intact and dry DVT Evaluation: No evidence of DVT seen on physical exam.  Recent Labs    08/08/21 0828 08/09/21 0437  HGB 10.6* 7.5*  HCT 34.0* 23.4*    Assessment/Plan: Status post Cesarean section. Doing well postoperatively.  Continue current care with the addition of IV Venofer ordered due to acute blood loss anemia.  Anticipate d/c home tomorrow. Will get fasting CBG in the morning.  Arabella Merles CNM 08/09/2021, 9:31 AM

## 2021-08-10 ENCOUNTER — Other Ambulatory Visit (HOSPITAL_COMMUNITY): Payer: Self-pay

## 2021-08-10 LAB — SURGICAL PATHOLOGY

## 2021-08-10 LAB — GLUCOSE, CAPILLARY: Glucose-Capillary: 85 mg/dL (ref 70–99)

## 2021-08-10 MED ORDER — IBUPROFEN 600 MG PO TABS
600.0000 mg | ORAL_TABLET | Freq: Four times a day (QID) | ORAL | 0 refills | Status: DC | PRN
  Filled 2021-08-10: qty 30, 8d supply, fill #0

## 2021-08-10 MED ORDER — ACETAMINOPHEN 325 MG PO TABS
650.0000 mg | ORAL_TABLET | Freq: Four times a day (QID) | ORAL | Status: DC | PRN
  Administered 2021-08-10 (×2): 650 mg via ORAL
  Filled 2021-08-10 (×2): qty 2

## 2021-08-10 MED ORDER — ACETAMINOPHEN 325 MG PO TABS
650.0000 mg | ORAL_TABLET | Freq: Four times a day (QID) | ORAL | Status: DC | PRN
Start: 2021-08-10 — End: 2021-10-04

## 2021-08-10 MED ORDER — OXYCODONE HCL 5 MG PO TABS
5.0000 mg | ORAL_TABLET | Freq: Four times a day (QID) | ORAL | 0 refills | Status: DC | PRN
  Filled 2021-08-10: qty 10, 2d supply, fill #0

## 2021-08-14 ENCOUNTER — Encounter (HOSPITAL_COMMUNITY)
Admission: RE | Admit: 2021-08-14 | Discharge: 2021-08-14 | Disposition: A | Discharge status: Home / Self Care | Payer: Medicaid Other | Source: Ambulatory Visit | Attending: Obstetrics & Gynecology | Admitting: Obstetrics & Gynecology

## 2021-08-16 ENCOUNTER — Encounter: Payer: Medicaid Other | Admitting: Family Medicine

## 2021-08-22 ENCOUNTER — Telehealth (HOSPITAL_COMMUNITY): Payer: Self-pay | Admitting: *Deleted

## 2021-08-22 NOTE — Telephone Encounter (Signed)
Patient voiced no questions or concerns regarding her own health. EPDS = 0. Patient voiced no questions or concerns regarding baby at this time. Patient reported infant sleeps in a bassinet on his back. RN reviewed ABCs of safe sleep - patient verbalized understanding. Patient requested RN email information on hospital's virtual postpartum classes and support groups. Email sent. Deforest Hoyles, RN, 08/22/21, [phone].

## 2021-08-24 ENCOUNTER — Encounter: Payer: Self-pay | Admitting: Family Medicine

## 2021-08-24 ENCOUNTER — Other Ambulatory Visit: Payer: Self-pay

## 2021-08-24 ENCOUNTER — Ambulatory Visit (INDEPENDENT_AMBULATORY_CARE_PROVIDER_SITE_OTHER): Payer: Medicaid Other | Admitting: Family Medicine

## 2021-08-24 VITALS — BP 132/84 | HR 73 | Ht 59.0 in | Wt 160.0 lb

## 2021-08-24 DIAGNOSIS — Z98891 History of uterine scar from previous surgery: Secondary | ICD-10-CM | POA: Diagnosis not present

## 2021-08-24 DIAGNOSIS — Z9851 Tubal ligation status: Secondary | ICD-10-CM | POA: Diagnosis not present

## 2021-08-24 DIAGNOSIS — Z4889 Encounter for other specified surgical aftercare: Secondary | ICD-10-CM | POA: Diagnosis not present

## 2021-08-24 NOTE — Progress Notes (Signed)
Patient presents for incision check.patient is bottle feeding. Patient had tubal ligation. Armandina Stammer RN

## 2021-08-24 NOTE — Progress Notes (Signed)
GYNECOLOGY OFFICE VISIT NOTE  History:   Abigail Davidson is a 35 y.o. [phone] here today for incision check.  Patient is s/p pLTCS for macrosomic infant in setting of GDM as well as bilateral salpingectomy She reports she is doing well since delivery, pain and vaginal bleeding have been minimal No significant discharge from wound  Health Maintenance Due  Topic Date Due   COVID-19 Vaccine (1) Never done   Pneumococcal Vaccine 59-64 Years old (1 - PCV) Never done   URINE MICROALBUMIN  Never done   INFLUENZA VACCINE  Never done    Past Medical History:  Diagnosis Date   Anemia    Gestational diabetes    Pregnancy induced hypertension    Preterm labor     Past Surgical History:  Procedure Laterality Date   CESAREAN SECTION N/A 08/08/2021   Procedure: CESAREAN SECTION;  Surgeon: Federico Flake, MD;  Location: [facility] LD ORS;  Service: Obstetrics;  Laterality: N/A;    The following portions of the patient's history were reviewed and updated as appropriate: allergies, current medications, past family history, past medical history, past social history, past surgical history and problem list.   Health Maintenance:   Last pap: Lab Results  Component Value Date   DIAGPAP  02/06/2021    - Negative for Intraepithelial Lesions or Malignancy (NILM)   DIAGPAP - Benign reactive/reparative changes 02/06/2021   HPVHIGH Negative 02/06/2021     Last mammogram:  N/a    Review of Systems:  Pertinent items noted in HPI and remainder of comprehensive ROS otherwise negative.  Physical Exam:  BP 132/84   Pulse 73   Ht 4\' 11"  (1.499 m)   Wt 160 lb (72.6 kg)   LMP 11/07/2020 (Exact Date)   Breastfeeding No   BMI 32.32 kg/m  CONSTITUTIONAL: Well-developed, well-nourished female in no acute distress.  HEENT:  Normocephalic, atraumatic. External right and left ear normal. No scleral icterus.  NECK: Normal range of motion, supple, no masses noted on observation SKIN: Cesarean  wound with steri strips still in place. These were removed, no signs of erythema or exudate. Wound well approximated, several small <1 cm areas of granulation tissue, silver nitrate applied with patient's permission. MUSCULOSKELETAL: Normal range of motion. No edema noted. NEUROLOGIC: Alert and oriented to person, place, and time. Normal muscle tone coordination.  PSYCHIATRIC: Normal mood and affect. Normal behavior. Normal judgment and thought content. RESPIRATORY: Effort normal, no problems with respiration noted ABDOMEN: No masses noted. No other overt distention noted.  Uterus firm below umbilicus. PELVIC: Deferred  Labs and Imaging No results found for this or any previous visit (from the past 168 hour(s)). 11/09/2020 MFM OB FOLLOW UP  Result Date: 07/25/2021 ----------------------------------------------------------------------  OBSTETRICS REPORT                       (Signed Final 07/25/2021 12:05 pm) ---------------------------------------------------------------------- Patient Info  ID #:       07/27/2021                          D.O.B.:  03-May-1986 (34 yrs)  Name:       Abigail Davidson           Visit Date: 07/25/2021 10:42 am ---------------------------------------------------------------------- Performed By  Attending:        07/27/2021      Ref. Address:     [address]  MD                                                             Rd                                                             Jacky Kindle                                                             [phone]  Performed By:     Earley Brooke     Location[facility]                    BS, RDMS                                 [facility]  Referred By:      Levie Heritage                    MD  ---------------------------------------------------------------------- Orders  #  Description                           Code        Ordered By  1  [place] MFM OB FOLLOW UP                   [id number]    Noralee Space ----------------------------------------------------------------------  #  Order #                     Accession #                Episode #  1  [id number]                   [id number]                 [id number] ---------------------------------------------------------------------- Indications  Gestational diabetes in pregnancy, diet        O24.410  controlled  [redacted]  weeks gestation of pregnancy                Z3A.35  Large for gestational age fetus affecting      O36.60X0  management of mother  Poor obstetric history: Previous preterm       O09.219  delivery, antepartum  Maternal care for (suspected) hereditary       O35.2XX1  disease in fetus, fetus 1- silent alpha thal  Poor obstetric history: Previous               O09.299  preeclampsia / eclampsia/gestational HTN  Genetic carrier (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 Cord Insertion:      Previously Visualized  Amniotic Fluid  AFI FV:      Within normal limits  AFI Sum(cm)     %Tile       Largest Pocket(cm)  19.53           73          6.79  RUQ(cm)       RLQ(cm)       LUQ(cm)        LLQ(cm)  5.43          3.53          6.79           3.78 ---------------------------------------------------------------------- Biometry  BPD:      97.5  mm     G. Age:  39w 6d       > 99  %    CI:        83.82   %    70 - 86                                                          FL/HC:      20.0   %    20.1 - 22.1  HC:      335.7  mm      G. Age:  38w 3d         80  %    HC/AC:      0.90        0.93 - 1.11  AC:       373   mm     G. Age:  41w 2d       > 99  %    FL/BPD:     69.0   %    71 - 87  FL:       67.3  mm     G. Age:  34w 4d         16  %    FL/AC:      18.0   %    20 - 24  HUM:      58.2  mm     G. Age:  33w 5d         27  %  LV:        5.7  mm  Est. FW:    3801  gm      8 lb 6 oz   > 99  % ---------------------------------------------------------------------- OB History  Gravidity:    3         Term:   1        Prem:   1  Living:       2 ---------------------------------------------------------------------- Gestational Age  LMP:           37w 1d        Date:  11/07/20                 EDD:   08/14/21  U/S Today:     38w 4d                                        EDD:   08/04/21  Best:          35w 6d     Det. ByMarcella Dubs         EDD:   08/23/21                                      (02/06/21) ---------------------------------------------------------------------- Anatomy  Cranium:               Appears normal         Aortic Arch:            Previously seen  Cavum:                 Previously seen        Ductal Arch:            Previously seen  Ventricles:            Appears normal         Diaphragm:              Appears normal  Choroid Plexus:        Previously seen        Stomach:                Appears normal, left                                                                        sided  Cerebellum:            Previously seen        Abdomen:                Appears normal  Posterior Fossa:       Previously seen        Abdominal Wall:         Previously seen  Nuchal Fold:           Previously seen        Cord Vessels:  Previously seen  Face:                  Orbits and profile     Kidneys:                Appear normal                         previously seen  Lips:                  Previously seen        Bladder:                Appears normal  Thoracic:              Previously seen        Spine:                  Previously  seen  Heart:                 Previously seen        Upper Extremities:      Previously seen  RVOT:                  Previously seen        Lower Extremities:      Previously seen  LVOT:                  Previously seen  Other:  Female gender previously seen. VC, 3VV and 3VTV previously          visualized. ---------------------------------------------------------------------- Cervix Uterus Adnexa  Cervix  Not visualized (advanced GA >24wks)  Right Ovary  Previously seen  Left Ovary  Previously seen. ---------------------------------------------------------------------- Impression  Follow up growth due to A1GDM and large for gestational  age.  Normal interval growth with measurements consistent with  large for gestational age.  Good fetal movement and amniotic fluid volume  Ms. Ortiz-Davidson shared that her blood sugars are within  range. ---------------------------------------------------------------------- Recommendations  Follow up as clinically indicated. ----------------------------------------------------------------------               Lin Landsman, MD Electronically Signed Final Report   07/25/2021 12:05 pm ----------------------------------------------------------------------     Assessment and Plan:   Problem List Items Addressed This Visit       Other   S/P primary low transverse C-section   H/O tubal ligation   Other Visit Diagnoses     Encounter for post surgical wound check    -  Primary   Postpartum exam          Patient doing well s/p pLTCS+bilateral salpingectomy. Wound is well approximated, only a few small areas of granulation tissue, silver nitrate applied with patient's permission with good effect. Discussed warning signs, RTC in 4-5 weeks for PP check and PP GTT.   Return in about 4 weeks (around 09/21/2021) for PP check.    Total face-to-face time with patient: 10 minutes.  Over 50% of encounter was spent on counseling and coordination of care.   Venora Maples,  MD/MPH Attending Family Medicine Physician, [facility] for [facility], [facility]

## 2021-09-13 ENCOUNTER — Ambulatory Visit: Payer: Medicaid Other | Admitting: Family Medicine

## 2021-09-20 ENCOUNTER — Ambulatory Visit: Payer: Medicaid Other | Admitting: Family Medicine

## 2021-09-20 ENCOUNTER — Encounter: Payer: Self-pay | Admitting: Family Medicine

## 2021-09-20 NOTE — Progress Notes (Signed)
Patient did not keep appointment today. She will be called to reschedule.  

## 2021-10-04 ENCOUNTER — Other Ambulatory Visit: Payer: Self-pay

## 2021-10-04 ENCOUNTER — Ambulatory Visit (INDEPENDENT_AMBULATORY_CARE_PROVIDER_SITE_OTHER): Payer: Medicaid Other | Admitting: Family Medicine

## 2021-10-04 DIAGNOSIS — Z9851 Tubal ligation status: Secondary | ICD-10-CM

## 2021-10-04 DIAGNOSIS — Z98891 History of uterine scar from previous surgery: Secondary | ICD-10-CM | POA: Diagnosis not present

## 2021-10-04 DIAGNOSIS — Z23 Encounter for immunization: Secondary | ICD-10-CM

## 2021-10-04 NOTE — Progress Notes (Signed)
Post Partum Visit Note  Abigail Davidson is a 35 y.o. [phone] female who presents for a postpartum visit. She is 8 weeks postpartum following a primary cesarean section.  I have fully reviewed the prenatal and intrapartum course. The delivery was at 37.6 gestational weeks.  Anesthesia: spinal. Postpartum course has been uncomplicated. Baby is doing well. Baby is feeding by bottle -   . Bleeding no bleeding. Bowel function is normal. Bladder function is normal. Patient is sexually active. Contraception method is tubal ligation. Postpartum depression screening: negative.   The pregnancy intention screening data noted above was reviewed. Potential methods of contraception were discussed. The patient elected to proceed with No data recorded.   Edinburgh Postnatal Depression Scale - 10/04/21 1021       Edinburgh Postnatal Depression Scale:  In the Past 7 Days   I have been able to laugh and see the funny side of things. 0    I have looked forward with enjoyment to things. 0    I have blamed myself unnecessarily when things went wrong. 0    I have been anxious or worried for no good reason. 0    I have felt scared or panicky for no good reason. 0    Things have been getting on top of me. 0    I have been so unhappy that I have had difficulty sleeping. 0    I have felt sad or miserable. 0    I have been so unhappy that I have been crying. 0    The thought of harming myself has occurred to me. 0    Edinburgh Postnatal Depression Scale Total 0             Health Maintenance Due  Topic Date Due   COVID-19 Vaccine (1) Never done   Pneumococcal Vaccine 48-26 Years old (1 - PCV) Never done   URINE MICROALBUMIN  Never done    The following portions of the patient's history were reviewed and updated as appropriate: allergies, current medications, past family history, past medical history, past social history, past surgical history, and problem list.  Review of Systems Pertinent  items are noted in HPI.  Objective:  BP 137/73   Pulse (!) 59   Ht 4\' 11"  (1.499 m)   Wt 163 lb (73.9 kg)   LMP 11/07/2020 (Exact Date)   Breastfeeding No   BMI 32.92 kg/m    General:  alert, cooperative, and no distress   Breasts:  not indicated  Lungs: clear to auscultation bilaterally  Heart:  regular rate and rhythm, S1, S2 normal, no murmur, click, rub or gallop  Abdomen: soft, non-tender; bowel sounds normal; no masses,  no organomegaly   Wound well approximated incision  GU exam:  not indicated       Assessment:   1. Postpartum exam   2. S/P primary low transverse C-section   3. H/O tubal ligation      Plan:   Essential components of care per ACOG recommendations:  1.  Mood and well being: Patient with negative depression screening today. Reviewed local resources for support.  - Patient tobacco use? No.   - hx of drug use? No.    2. Infant care and feeding:  -Patient currently breastmilk feeding? No.  -Social determinants of health (SDOH) reviewed in EPIC. No concerns  3. Sexuality, contraception and birth spacing - Patient does not want a pregnancy in the next year.  Desired family size is 3 children.  -  Reviewed forms of contraception in tiered fashion. Patient desired bilateral tubal ligation today.     4. Sleep and fatigue -Encouraged family/partner/community support of 4 hrs of uninterrupted sleep to help with mood and fatigue  5. Physical Recovery  - Discussed patients delivery and complications. She describes her labor as good. - Patient had a C-section.  - Patient has urinary incontinence? No. - Patient is safe to resume physical and sexual activity  6.  Health Maintenance - HM due items addressed  - Last pap smear  Diagnosis  Date Value Ref Range Status  02/06/2021   Final   - Negative for Intraepithelial Lesions or Malignancy (NILM)  02/06/2021 - Benign reactive/reparative changes  Final   Pap smear not done at today's visit.  -Breast  Cancer screening indicated? No.   7. Chronic Disease/Pregnancy Condition follow up: Gestational Diabetes Connected with primary care for follow up of GDM.  - PCP follow up  Levie Heritage, DO [facility] [employer], [facility]

## 2021-10-09 ENCOUNTER — Other Ambulatory Visit: Payer: Medicaid Other

## 2021-12-18 IMAGING — US US MFM OB FOLLOW-UP
1 series · 13 of 28 positions shown · non-contrast
Comparison: none

[Series 1: us mfm ob follow-up · 13 of 78 slices shown]
[im 3/78]
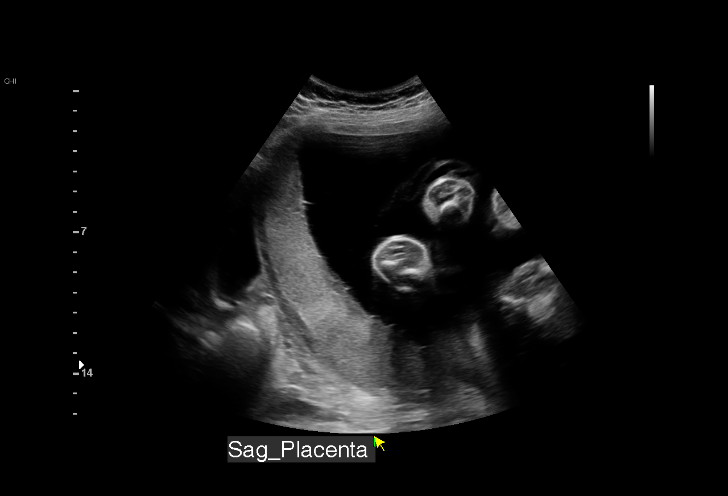
[im 9/78]
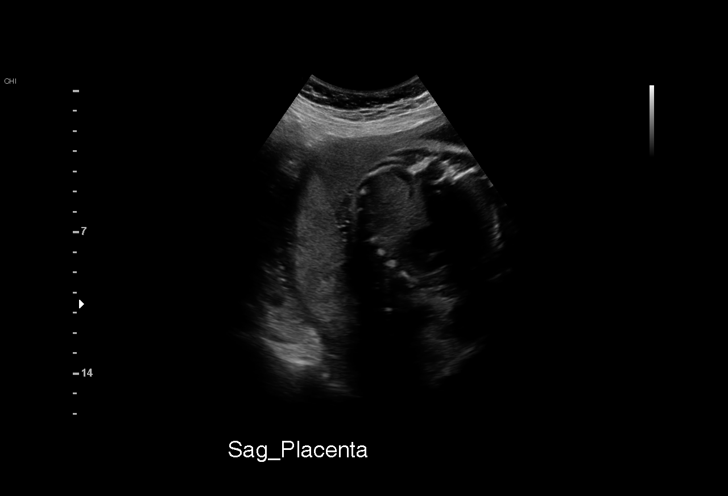
[im 15/78]
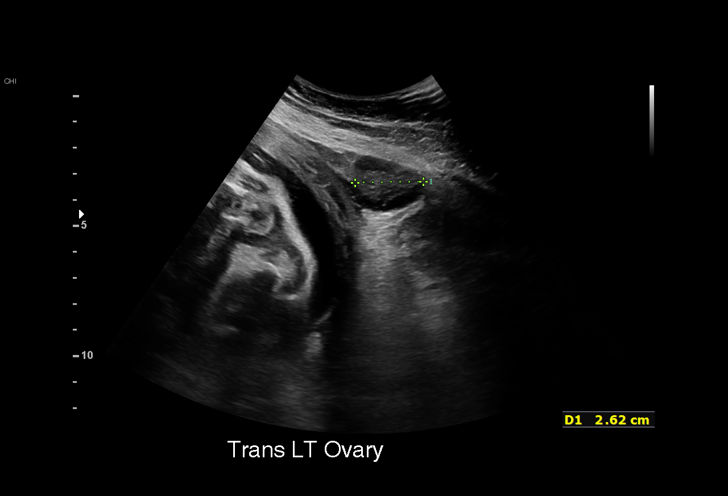
[im 20/78]
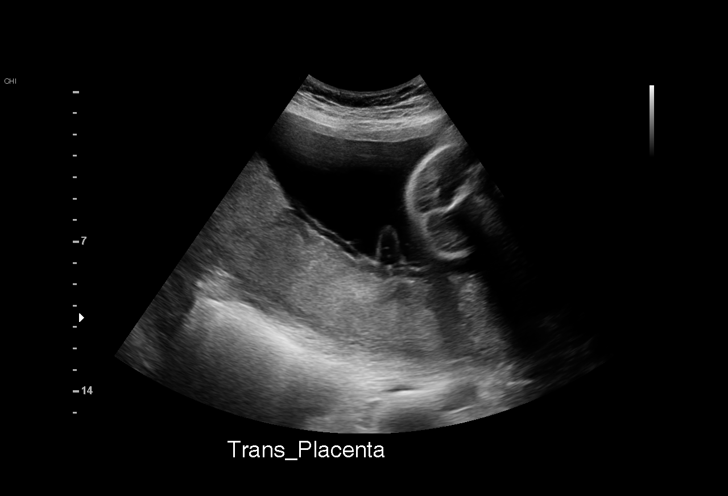
[im 26/78]
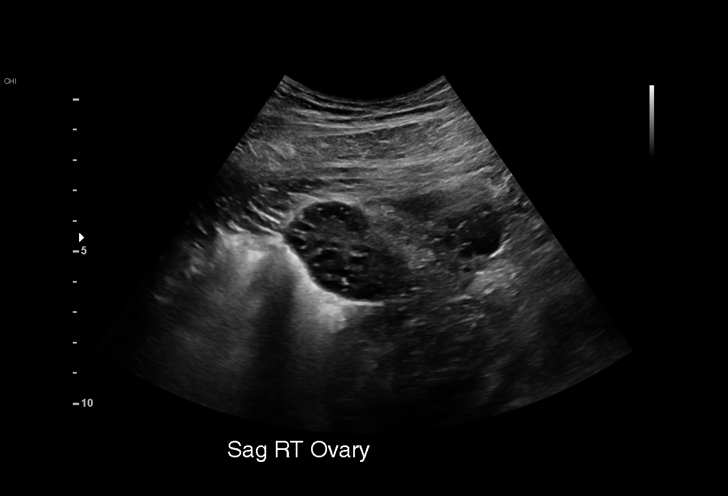
[im 32/78]
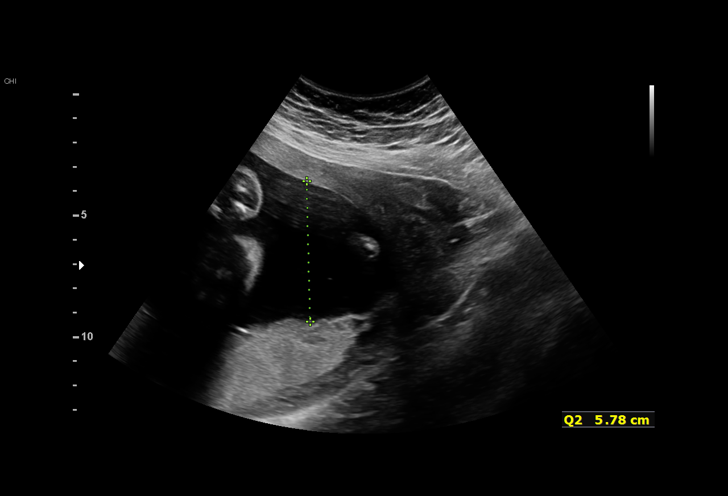
[im 40/78]
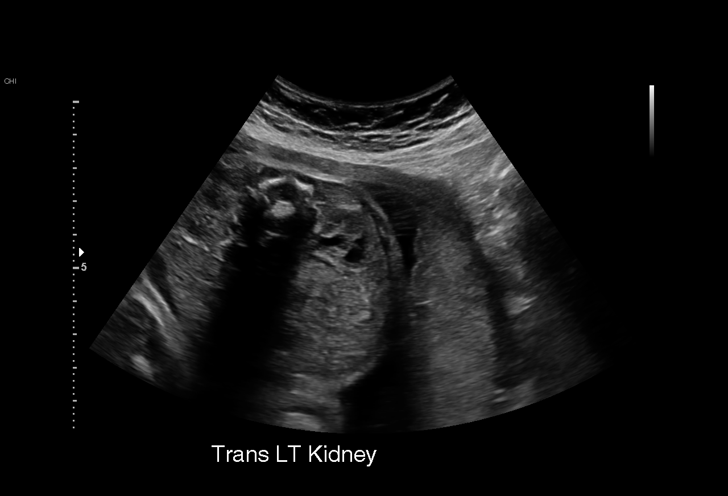
[im 46/78]
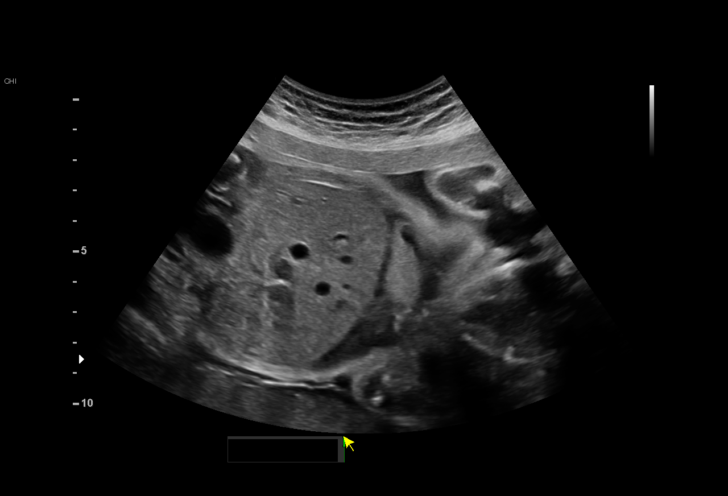
[im 52/78]
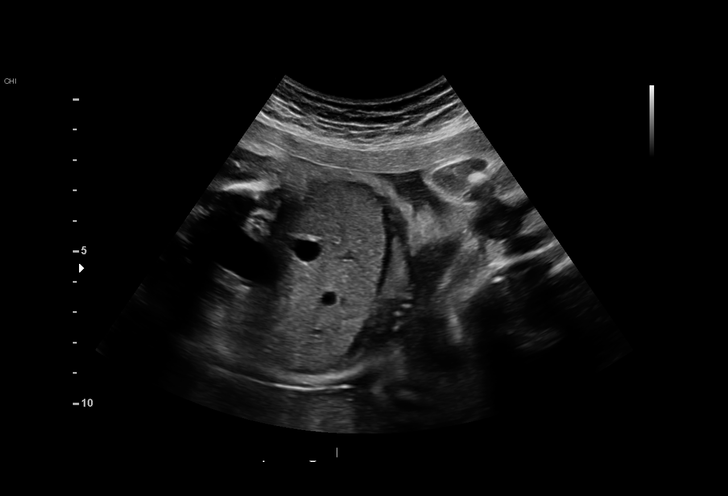
[im 58/78]
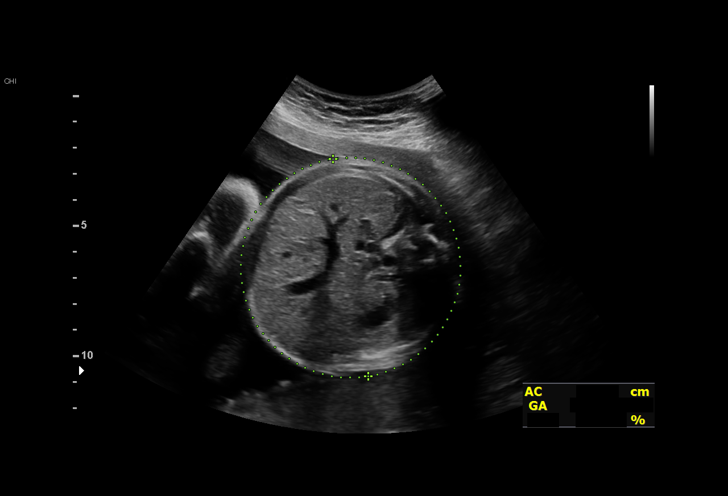
[im 63/78]
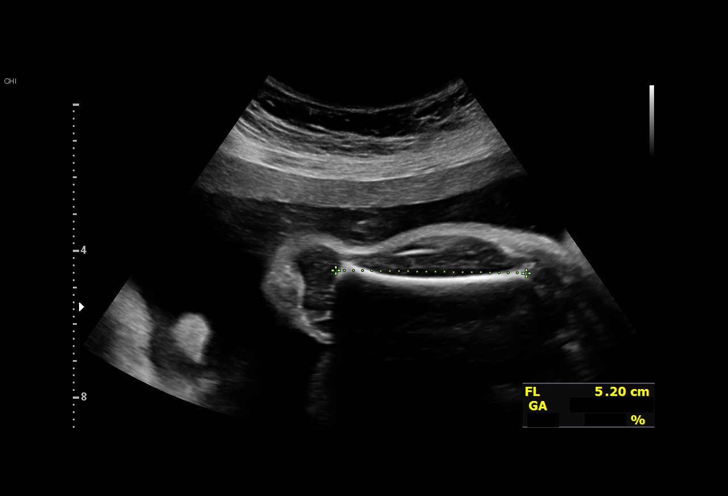
[im 69/78]
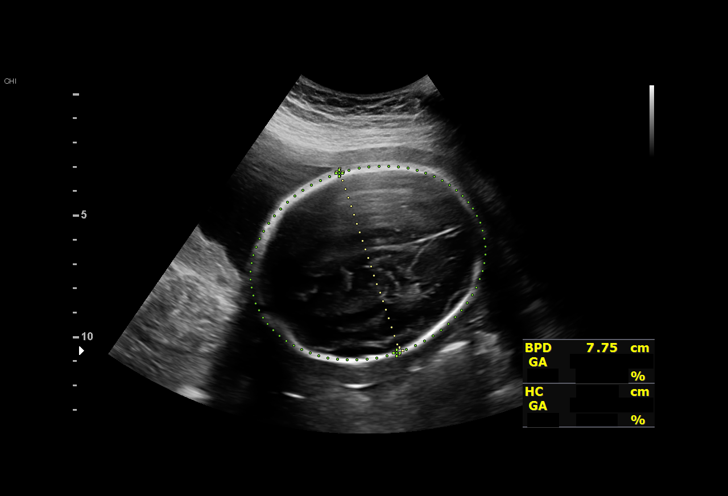
[im 75/78]
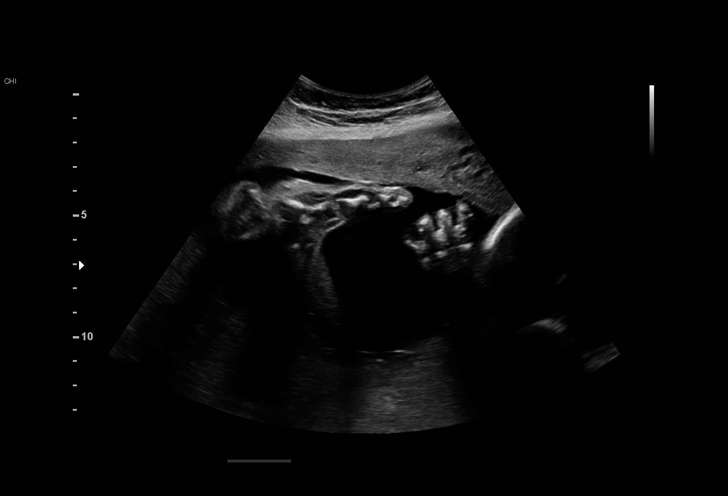

[13 of 28 positions shown; findings below may reference images not displayed]

Indications

 Large for gestational age fetus affecting
 management of mother
 Poor obstetric history: Previous preterm
 delivery, antepartum
 Maternal care for (suspected) hereditary
 disease in fetus, fetus 1- silent Alia Tiger
 Geoff obstetric history: Previous
 preeclampsia / eclampsia/gestational HTN
 27 weeks gestation of pregnancy
 Genetic carrier (silent carrier alpha
 thalassemia)-had genetic counseling
Fetal Evaluation

 Num Of Fetuses:         1
 Fetal Heart Rate(bpm):  137
 Cardiac Activity:       Observed
 Presentation:           Cephalic
 Placenta:               Posterior Fundal
 P. Cord Insertion:      Visualized, central

 Amniotic Fluid
 AFI FV:      Within normal limits

 AFI Sum(cm)     %Tile       Largest Pocket(cm)
 20.06           80

 RUQ(cm)       RLQ(cm)       LUQ(cm)        LLQ(cm)

Biometry

 BPD:      76.9  mm     G. Age:  30w 6d         99  %    CI:        76.48   %    70 - 86
                                                         FL/HC:      18.6   %    18.8 -
 HC:      278.6  mm     G. Age:  30w 3d         92  %    HC/AC:      1.05        1.05 -
 AC:      265.2  mm     G. Age:  30w 4d         98  %    FL/BPD:     67.2   %    71 - 87
 FL:       51.7  mm     G. Age:  27w 4d         28  %    FL/AC:      19.5   %    20 - 24

 LV:        4.1  mm

 Est. FW:    0466  gm      3 lb 3 oz     95  %
OB History

 Gravidity:    3         Term:   1        Prem:   1
 Living:       2
Gestational Age

 LMP:           29w 1d        Date:  11/07/20                 EDD:   08/14/21
 U/S Today:     29w 6d                                        EDD:   08/09/21
 Best:          27w 6d     Det. By:  Early Ultrasound         EDD:   08/23/21
                                     (02/06/21)
Anatomy

 Cranium:               Appears normal         Aortic Arch:            Previously seen
 Cavum:                 Appears normal         Ductal Arch:            Previously seen
 Ventricles:            Appears normal         Diaphragm:              Appears normal
 Choroid Plexus:        Previously seen        Stomach:                Appears normal, left
                                                                       sided
 Cerebellum:            Previously seen        Abdomen:                Appears normal
 Posterior Fossa:       Previously seen        Abdominal Wall:         Previously seen
 Nuchal Fold:           Previously seen        Cord Vessels:           Appears normal (3
                                                                       vessel cord)
 Face:                  Orbits and profile     Kidneys:                Appear normal
                        previously seen
 Lips:                  Previously seen        Bladder:                Appears normal
 Thoracic:              Appears normal         Spine:                  Previously seen
 Heart:                 Appears normal         Upper Extremities:      Previously seen
                        (4CH, axis, and
                        situs)
 RVOT:                  Previously seen        Lower Extremities:      Previously seen
 LVOT:                  Previously seen

 Other:  Previously fetus appears to be a male. VC, 3VV and 3VTV previously
         visualized. Nasal bone previously visualized.
Cervix Uterus Adnexa

 Cervix
 Not visualized (advanced GA >28wks)

 Uterus
 Single fibroid noted, see table below.

 Right Ovary
 Within normal limits.
 Left Ovary
 Within normal limits.

 Cul De Sac
 No free fluid seen.

 Adnexa
 No abnormality visualized.
Myomas

 Site                     L(cm)      W(cm)      D(cm)       Location
 Left upper quadrant      2.1        2.47       1.98        Fundal LUQ

 Blood Flow                  RI       PI       Comments

Impression

 Patient returned for fetal growth and renal assessments.
 Bilateral urinary tract dilations were seen at anatomy scan.
 Obstetric history significant for a preterm delivery and patient
 developed local reaction to 17-OH progesterone injections.
 She takes oral progesterone 200 mg daily.
 On today's ultrasound, the estimated fetal weight is at the
 95th percentile.  Amniotic fluid is normal and good fetal
 activity seen.  Both kidneys appear normal and there is no
 evidence of urinary tract dilations.
 Patient will be screening for gestational diabetes next week.
Recommendations

 -An appointment was made for her to return in 4 weeks for
 fetal growth assessment.
                 [place], [place]

## 2022-01-15 IMAGING — US US MFM OB FOLLOW-UP
1 series · 13 of 28 positions shown · non-contrast
Comparison: none

[Series 1: us mfm ob follow-up · 47 acquisitions, 13 frames shown]
[im 2/47]
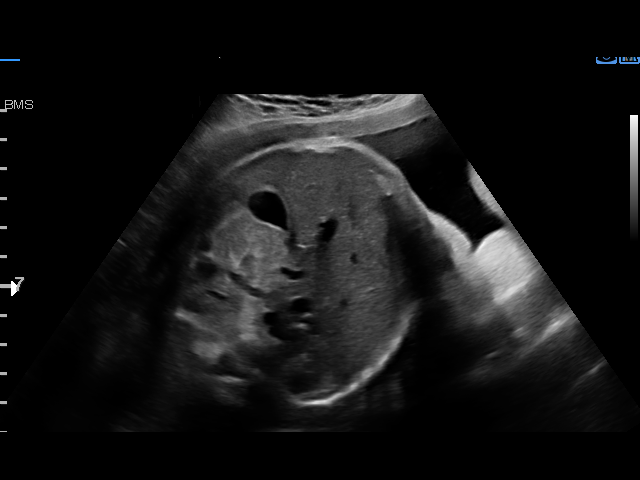
[im 6/47]
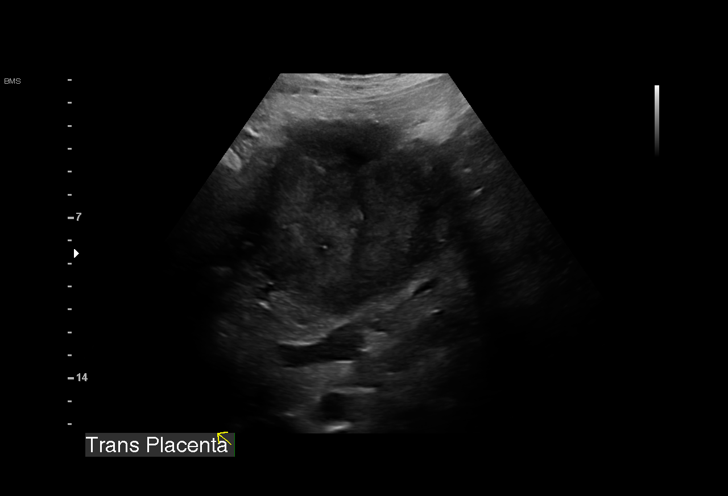
[im 9/47]
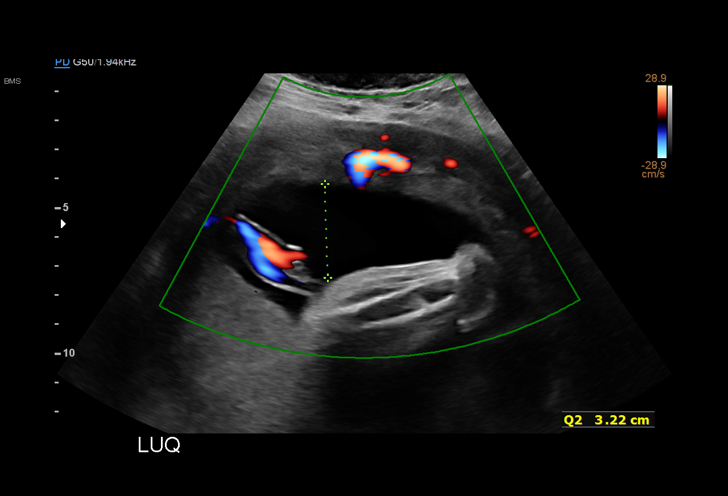
[im 12/47]
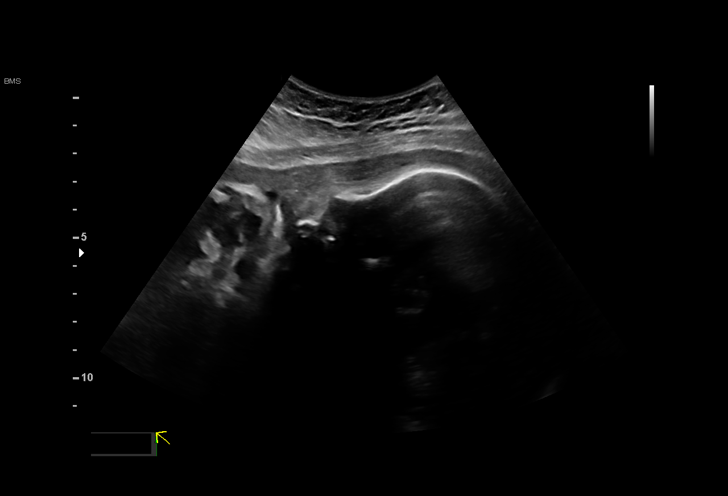
[im 16/47]
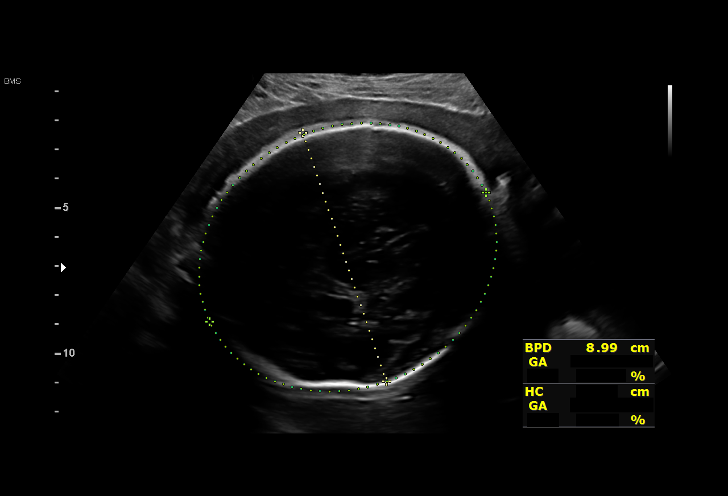
[im 19/47]
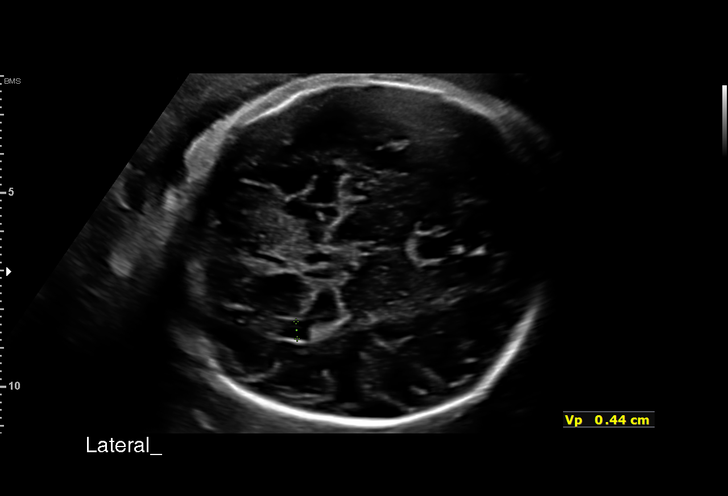
[im 24/47]
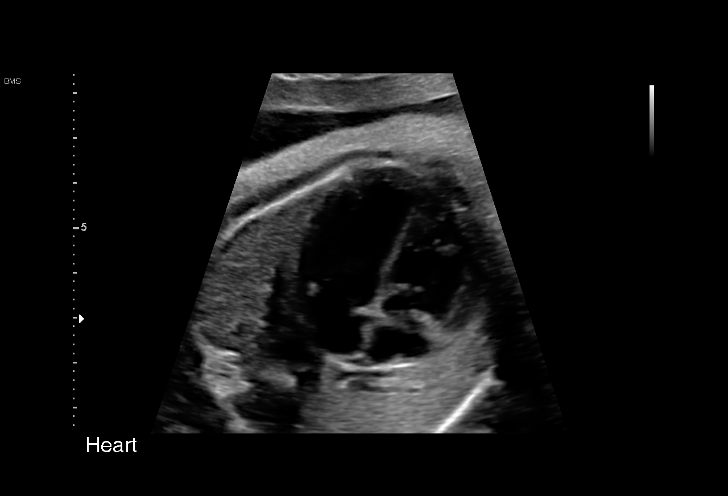
[im 28/47]
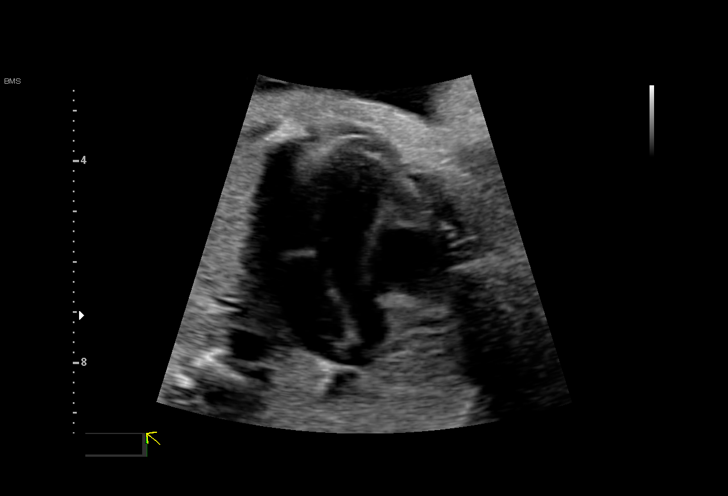
[im 31/47]
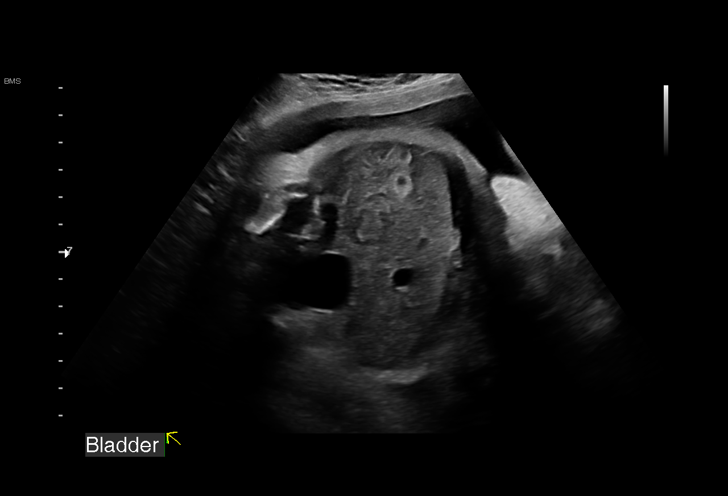
[im 35/47]
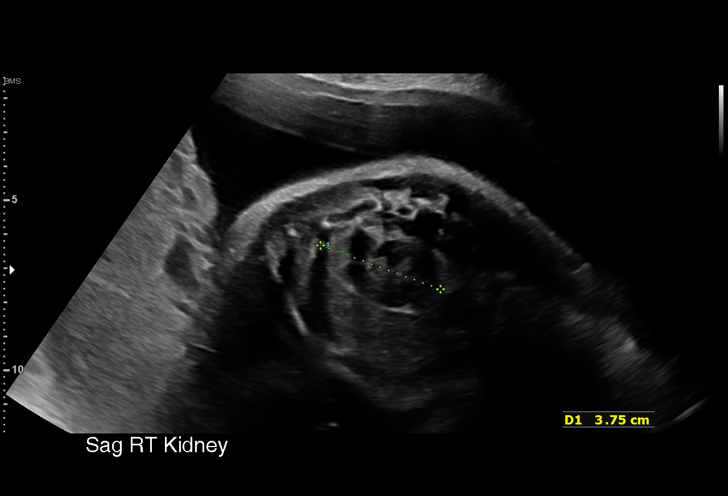
[im 38/47]
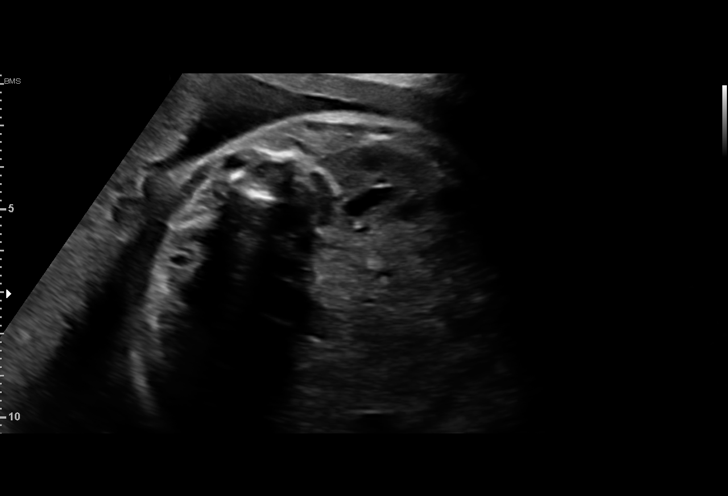
[im 41/47]
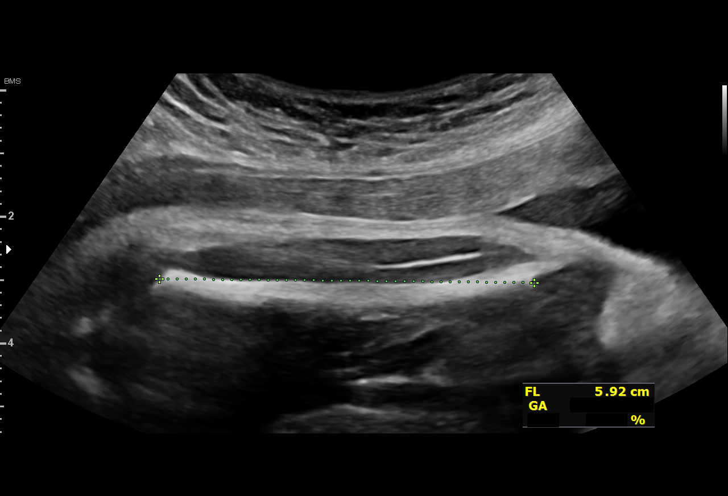
[im 45/47]
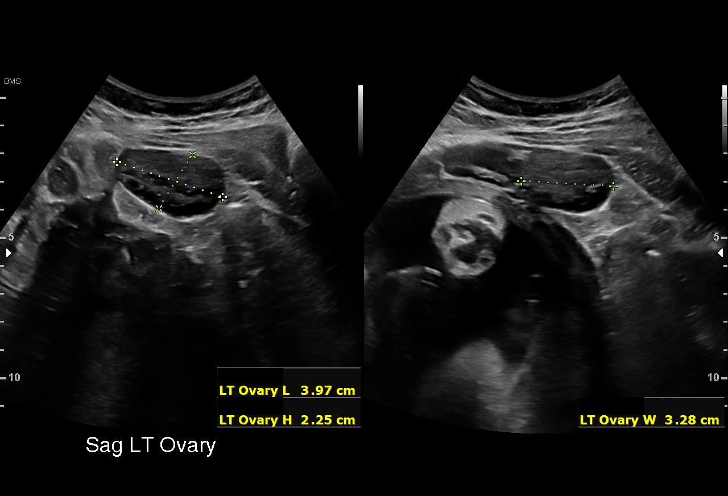

[13 of 28 positions shown; findings below may reference images not displayed]

Indications

 Gestational diabetes in pregnancy, diet
 controlled
 Large for gestational age fetus affecting
 management of mother
 Poor obstetric history: Previous preterm
 delivery, antepartum
 Maternal care for (suspected) hereditary
 disease in fetus, fetus 1- silent [place]
 [place] obstetric history: Previous
 preeclampsia / eclampsia/gestational HTN
 Genetic carrier (silent carrier alpha
 thalassemia)-had genetic counseling
 31 weeks gestation of pregnancy
Fetal Evaluation

 Num Of Fetuses:         1
 Fetal Heart Rate(bpm):  126
 Cardiac Activity:       Observed
 Presentation:           Cephalic
 Placenta:               Posterior Fundal
 P. Cord Insertion:      Previously Visualized

 Amniotic Fluid
 AFI FV:      Within normal limits

 AFI Sum(cm)     %Tile       Largest Pocket(cm)
 14.96           53
 RUQ(cm)       RLQ(cm)       LUQ(cm)        LLQ(cm)

Biometry

 BPD:      88.6  mm     G. Age:  35w 6d       > 99  %    CI:        83.85   %    70 - 86
                                                         FL/HC:      19.3   %    19.1 -
 HC:       305   mm     G. Age:  34w 0d         70  %    HC/AC:      0.95        0.96 -
 AC:      322.6  mm     G. Age:  36w 1d       > 99  %    FL/BPD:     66.6   %    71 - 87
 FL:         59  mm     G. Age:  30w 5d         13  %    FL/AC:      18.3   %    20 - 24

 LV:        4.4  mm

 Est. FW:    1004  gm      5 lb 6 oz     98  %
OB History

 Gravidity:    3         Term:   1        Prem:   1
 Living:       2
Gestational Age

 LMP:           33w 1d        Date:  11/07/20                 EDD:   08/14/21
 U/S Today:     34w 1d                                        EDD:   08/07/21
 Best:          31w 6d     Det. By:  Early Ultrasound         EDD:   08/23/21
                                     (02/06/21)
Anatomy

 Cranium:               Appears normal         Aortic Arch:            Previously seen
 Cavum:                 Appears normal         Ductal Arch:            Previously seen
 Ventricles:            Appears normal         Diaphragm:              Appears normal
 Choroid Plexus:        Previously seen        Stomach:                Appears normal, left
                                                                       sided
 Cerebellum:            Previously seen        Abdomen:                Appears normal
 Posterior Fossa:       Previously seen        Abdominal Wall:         Previously seen
 Nuchal Fold:           Previously seen        Cord Vessels:           Appears normal (3
                                                                       vessel cord)
 Face:                  Orbits and profile     Kidneys:                Appear normal
                        previously seen
 Lips:                  Previously seen        Bladder:                Appears normal
 Thoracic:              Previously seen        Spine:                  Previously seen
 Heart:                 Appears normal         Upper Extremities:      Previously seen
                        (4CH, axis, and
                        situs)
 RVOT:                  Previously seen        Lower Extremities:      Previously seen
 LVOT:                  Appears normal

 Other:  Previously fetus appears to be a male. VC, 3VV and 3VTV previously
         visualized.
Cervix Uterus Adnexa

 Cervix
 Not visualized (advanced GA >60wks)

 Uterus
 Single fibroid prev visualized
 Right Ovary
 Within normal limits.

 Left Ovary
 Within normal limits.

 Cul De Sac
 No free fluid seen.

 Adnexa
 No adnexal mass visualized.
Impression

 Patient returned for fetal growth assessment.  She has
 gestational diabetes and reports that fasting and postprandial
 levels are within normal range.  She is checking her blood
 glucose regularly.
 Her pregnancy is well dated by 11-week ultrasound.  Blood
 pressure today at her office is 108/61 mmHg.

 On today's ultrasound, the estimated fetal weight is at the
 98th percentile.  Amniotic fluid is normal and good fetal
 activity seen.  Cephalic presentation.  Both kidneys appear
 normal with no evidence of urinary tract dilations (seen at
 anatomy scan).

 He previously pregnancy was compllicated by preeclampsia
Recommendations

 -An appointment was made for her to return in 4 weeks for
 fetal growth assessment.
                 [place], [place]

## 2022-01-17 ENCOUNTER — Other Ambulatory Visit: Payer: Self-pay | Admitting: Family Medicine

## 2022-01-17 DIAGNOSIS — Z3201 Encounter for pregnancy test, result positive: Secondary | ICD-10-CM
